# Patient Record
Sex: Male | Born: 1950 | Hispanic: No | Marital: Married | Smoking: Never smoker
Health system: Southern US, Community
[De-identification: ages and names within clinical notes are randomized; demographics above are authoritative.]

## PROBLEM LIST (undated history)

## (undated) DIAGNOSIS — E785 Hyperlipidemia, unspecified: Secondary | ICD-10-CM

## (undated) DIAGNOSIS — M1990 Unspecified osteoarthritis, unspecified site: Secondary | ICD-10-CM

---

## 2002-04-03 ENCOUNTER — Emergency Department (HOSPITAL_COMMUNITY): Admission: EM | Admit: 2002-04-03 | Discharge: 2002-04-03 | Payer: Self-pay | Admitting: Emergency Medicine

## 2002-04-12 ENCOUNTER — Emergency Department (HOSPITAL_COMMUNITY): Admission: EM | Admit: 2002-04-12 | Discharge: 2002-04-12 | Payer: Self-pay | Admitting: Emergency Medicine

## 2007-03-12 ENCOUNTER — Emergency Department (HOSPITAL_COMMUNITY): Admission: EM | Admit: 2007-03-12 | Discharge: 2007-03-12 | Payer: Self-pay | Admitting: Emergency Medicine

## 2007-03-16 ENCOUNTER — Emergency Department (HOSPITAL_COMMUNITY): Admission: EM | Admit: 2007-03-16 | Discharge: 2007-03-16 | Payer: Self-pay | Admitting: Emergency Medicine

## 2015-12-25 ENCOUNTER — Emergency Department (HOSPITAL_COMMUNITY): Payer: Self-pay

## 2015-12-25 ENCOUNTER — Inpatient Hospital Stay (HOSPITAL_COMMUNITY)
Admission: EM | Admit: 2015-12-25 | Discharge: 2015-12-28 | DRG: 193 | Disposition: A | Discharge status: Home / Self Care | Payer: Self-pay | Attending: Internal Medicine | Admitting: Internal Medicine

## 2015-12-25 ENCOUNTER — Encounter (HOSPITAL_COMMUNITY): Payer: Self-pay | Admitting: Nurse Practitioner

## 2015-12-25 DIAGNOSIS — J439 Emphysema, unspecified: Secondary | ICD-10-CM | POA: Diagnosis present

## 2015-12-25 DIAGNOSIS — E860 Dehydration: Secondary | ICD-10-CM

## 2015-12-25 DIAGNOSIS — D638 Anemia in other chronic diseases classified elsewhere: Secondary | ICD-10-CM | POA: Diagnosis present

## 2015-12-25 DIAGNOSIS — E872 Acidosis, unspecified: Secondary | ICD-10-CM

## 2015-12-25 DIAGNOSIS — N289 Disorder of kidney and ureter, unspecified: Secondary | ICD-10-CM

## 2015-12-25 DIAGNOSIS — E869 Volume depletion, unspecified: Secondary | ICD-10-CM

## 2015-12-25 DIAGNOSIS — F17210 Nicotine dependence, cigarettes, uncomplicated: Secondary | ICD-10-CM | POA: Diagnosis present

## 2015-12-25 DIAGNOSIS — E43 Unspecified severe protein-calorie malnutrition: Secondary | ICD-10-CM | POA: Insufficient documentation

## 2015-12-25 DIAGNOSIS — J189 Pneumonia, unspecified organism: Principal | ICD-10-CM

## 2015-12-25 DIAGNOSIS — R634 Abnormal weight loss: Secondary | ICD-10-CM

## 2015-12-25 DIAGNOSIS — E785 Hyperlipidemia, unspecified: Secondary | ICD-10-CM | POA: Diagnosis present

## 2015-12-25 DIAGNOSIS — Z681 Body mass index (BMI) 19 or less, adult: Secondary | ICD-10-CM

## 2015-12-25 DIAGNOSIS — R64 Cachexia: Secondary | ICD-10-CM

## 2015-12-25 DIAGNOSIS — N179 Acute kidney failure, unspecified: Secondary | ICD-10-CM | POA: Diagnosis present

## 2015-12-25 DIAGNOSIS — D649 Anemia, unspecified: Secondary | ICD-10-CM

## 2015-12-25 HISTORY — DX: Unspecified osteoarthritis, unspecified site: M19.90

## 2015-12-25 HISTORY — DX: Hyperlipidemia, unspecified: E78.5

## 2015-12-25 LAB — CBC WITH DIFFERENTIAL/PLATELET
BASOS ABS: 0 10*3/uL (ref 0.0–0.1)
Basophils Relative: 0 %
EOS ABS: 0 10*3/uL (ref 0.0–0.7)
Eosinophils Relative: 0 %
HEMATOCRIT: 21.8 % — AB (ref 39.0–52.0)
HEMOGLOBIN: 7.1 g/dL — AB (ref 13.0–17.0)
LYMPHS PCT: 6 %
Lymphs Abs: 0.5 10*3/uL — ABNORMAL LOW (ref 0.7–4.0)
MCH: 27.4 pg (ref 26.0–34.0)
MCHC: 32.6 g/dL (ref 30.0–36.0)
MCV: 84.2 fL (ref 78.0–100.0)
MONOS PCT: 4 %
Monocytes Absolute: 0.3 10*3/uL (ref 0.1–1.0)
Neutro Abs: 7.4 10*3/uL (ref 1.7–7.7)
Neutrophils Relative %: 90 %
Platelets: 425 10*3/uL — ABNORMAL HIGH (ref 150–400)
RBC: 2.59 MIL/uL — AB (ref 4.22–5.81)
RDW: 15.8 % — AB (ref 11.5–15.5)
WBC Morphology: INCREASED
WBC: 8.2 10*3/uL (ref 4.0–10.5)

## 2015-12-25 LAB — COMPREHENSIVE METABOLIC PANEL
ALK PHOS: 69 U/L (ref 38–126)
ALT: 25 U/L (ref 17–63)
ANION GAP: 10 (ref 5–15)
AST: 24 U/L (ref 15–41)
Albumin: 2.8 g/dL — ABNORMAL LOW (ref 3.5–5.0)
BILIRUBIN TOTAL: 0.6 mg/dL (ref 0.3–1.2)
BUN: 50 mg/dL — ABNORMAL HIGH (ref 6–20)
CALCIUM: 8.5 mg/dL — AB (ref 8.9–10.3)
CO2: 18 mmol/L — ABNORMAL LOW (ref 22–32)
Chloride: 112 mmol/L — ABNORMAL HIGH (ref 101–111)
Creatinine, Ser: 1.69 mg/dL — ABNORMAL HIGH (ref 0.61–1.24)
GFR, EST AFRICAN AMERICAN: 48 mL/min — AB (ref >60)
GFR, EST NON AFRICAN AMERICAN: 41 mL/min — AB (ref >60)
Glucose, Bld: 162 mg/dL — ABNORMAL HIGH (ref 65–99)
POTASSIUM: 4.8 mmol/L (ref 3.5–5.1)
Sodium: 140 mmol/L (ref 135–145)
TOTAL PROTEIN: 6.5 g/dL (ref 6.5–8.1)

## 2015-12-25 LAB — POC OCCULT BLOOD, ED: Fecal Occult Bld: NEGATIVE

## 2015-12-25 LAB — I-STAT CG4 LACTIC ACID, ED
LACTIC ACID, VENOUS: 1.49 mmol/L (ref 0.5–1.9)
Lactic Acid, Venous: 1.09 mmol/L (ref 0.5–1.9)

## 2015-12-25 LAB — URINALYSIS, ROUTINE W REFLEX MICROSCOPIC
BILIRUBIN URINE: NEGATIVE
Glucose, UA: NEGATIVE mg/dL
HGB URINE DIPSTICK: NEGATIVE
KETONES UR: NEGATIVE mg/dL
Leukocytes, UA: NEGATIVE
NITRITE: NEGATIVE
PH: 5.5 (ref 5.0–8.0)
Protein, ur: NEGATIVE mg/dL
SPECIFIC GRAVITY, URINE: 1.02 (ref 1.005–1.030)

## 2015-12-25 MED ORDER — SODIUM CHLORIDE 0.9 % IV BOLUS (SEPSIS)
1290.0000 mL | Freq: Once | INTRAVENOUS | Status: AC
Start: 2015-12-25 — End: 2015-12-25
  Administered 2015-12-25: 1290 mL via INTRAVENOUS

## 2015-12-25 MED ORDER — DEXTROSE 5 % IV SOLN
500.0000 mg | Freq: Once | INTRAVENOUS | Status: AC
  Administered 2015-12-25: 500 mg via INTRAVENOUS
  Filled 2015-12-25: qty 500

## 2015-12-25 MED ORDER — CEFTRIAXONE SODIUM 1 G IJ SOLR
1.0000 g | Freq: Once | INTRAMUSCULAR | Status: AC
  Administered 2015-12-25: 1 g via INTRAVENOUS
  Filled 2015-12-25: qty 10

## 2015-12-25 MED ORDER — ACETAMINOPHEN 325 MG PO TABS
650.0000 mg | ORAL_TABLET | Freq: Once | ORAL | Status: AC
  Administered 2015-12-25: 650 mg via ORAL
  Filled 2015-12-25: qty 2

## 2015-12-25 NOTE — ED Provider Notes (Signed)
[facility]-EMERGENCY DEPT Provider Note   CSN: [id number] Arrival date & time: 12/25/15  1641  First Provider Contact:  First MD Initiated Contact with Patient 12/25/15 1846        History   Chief Complaint Chief Complaint  Patient presents with  . Shortness of Breath  . Fatigue    HPI Austin Ochoa is a 65 y.o. male.  He is brought in by family members from a community clinic for evaluation of cough. He has not seen a primary care provider in the last 3 years. He has had a 50 pound weight loss over the last several months. 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 He typically eats very little, reason for this is unknown. He has had a fever for about a week. There's been no nausea, vomiting, diarrhea, focal weakness or dizziness. He smokes cigarettes but does not drink alcohol or take illegal drugs. He is here with a family member who has not seen him for several years. There are no other known modifying factors.  HPI  Past Medical History:  Diagnosis Date  . Arthritis   . Hyperlipidemia     There are no active problems to display for this patient.   History reviewed. No pertinent surgical history.     Home Medications    Prior to Admission medications   Medication Sig Start Date End Date Taking? Authorizing Provider  Phenylephrine-DM (THERAFLU COLD/COUGH DAYTIME PO) Take 1 tablet by mouth every 6 (six) hours as needed (flu symtpoms).   Yes Historical Provider, MD    Family History No family history on file.  Social History Social History  Substance Use Topics  . Smoking status: Not on file  . Smokeless tobacco: Not on file  . Alcohol use Not on file     Allergies   Review of patient's allergies indicates no known allergies.   Review of Systems Review of Systems  All other systems reviewed and are negative.    Physical Exam Updated Vital Signs BP 98/69 (BP Location: Right Arm)   Pulse 64   Temp 100.5 F (38.1 C) (Oral)   Resp 18   Ht  (1.6  m)   Wt 94 lb (42.6 kg)   SpO2 94%   BMI 16.65 kg/m   Physical Exam  Constitutional: He appears well-developed.  He is cachectic in appearance  HENT:  Head: Normocephalic and atraumatic.  Poor dentition, no trismus  Eyes: Conjunctivae are normal.  Neck: Neck supple.  Cardiovascular: Normal rate and regular rhythm.   No murmur heard. Pulmonary/Chest: Effort normal. No respiratory distress.  Diffuse rhonchi with decreased air movement, right base.  Abdominal: Soft. He exhibits no distension and no mass. There is no tenderness. There is no rebound.  Musculoskeletal: He exhibits no edema.  Neurological: He is alert.  Skin: Skin is warm and dry.  Psychiatric: He has a normal mood and affect.  Nursing note and vitals reviewed.    ED Treatments / Results  Labs (all labs ordered are listed, but only abnormal results are displayed) Labs Reviewed  COMPREHENSIVE METABOLIC PANEL - Abnormal; Notable for the following:       Result Value   Chloride 112 (*)    CO2 18 (*)    Glucose, Bld 162 (*)    BUN 50 (*)    Creatinine, Ser 1.69 (*)    Calcium 8.5 (*)    Albumin 2.8 (*)    GFR calc non Af Amer 41 (*)    GFR  calc Af Amer 48 (*)    All other components within normal limits  CBC WITH DIFFERENTIAL/PLATELET - Abnormal; Notable for the following:    RBC 2.59 (*)    Hemoglobin 7.1 (*)    HCT 21.8 (*)    RDW 15.8 (*)    Platelets 425 (*)    Lymphs Abs 0.5 (*)    All other components within normal limits  URINALYSIS, ROUTINE W REFLEX MICROSCOPIC (NOT AT [facility]) - Abnormal; Notable for the following:    APPearance CLOUDY (*)    All other components within normal limits  CULTURE, BLOOD (ROUTINE X 2)  CULTURE, BLOOD (ROUTINE X 2)  URINE CULTURE  I-STAT CG4 LACTIC ACID, ED  I-STAT CG4 LACTIC ACID, ED  I-STAT CG4 LACTIC ACID, ED  POC OCCULT BLOOD, ED    EKG  EKG Interpretation  Date/Time:  Tuesday December 25 2015 18:04:05 EDT Ventricular Rate:  81 PR Interval:    QRS  Duration: 91 QT Interval:  352 QTC Calculation: 409 R Axis:   -51 Text Interpretation:  Sinus rhythm Borderline short PR interval Left axis deviation ST elevation, consider inferior injury No old tracing to compare Confirmed by [facility]  MD, Santita Hunsberger [phone]) on 12/25/2015 6:45:47 PM       Radiology Dg Chest 2 View  Result Date: 12/25/2015 CLINICAL DATA:  Fever, lethargy and shortness of breath. EXAM: CHEST  2 VIEW COMPARISON:  None. FINDINGS: Heart size is normal. Mediastinal shadows are normal. There is bilateral lower lobe bronchopneumonia left worse than right. Upper lobes probably show emphysema. No demonstrable effusion. No acute bone finding. IMPRESSION: Bilateral lower lobe pneumonia left worse than right. Probable underlying emphysema. Electronically Signed   By: Paulina Fusi M.D.   On: 12/25/2015 19:37   Procedures Procedures (including critical care time)  Medications Ordered in ED Medications  cefTRIAXone (ROCEPHIN) 1 g in dextrose 5 % 50 mL IVPB (0 g Intravenous Stopped 12/25/15 1950)  azithromycin (ZITHROMAX) 500 mg in dextrose 5 % 250 mL IVPB (0 mg Intravenous Stopped 12/25/15 2011)  sodium chloride 0.9 % bolus 1,290 mL (0 mLs Intravenous Stopped 12/25/15 2100)  acetaminophen (TYLENOL) tablet 650 mg (650 mg Oral Given 12/25/15 2101)     Initial Impression / Assessment and Plan / ED Course  I have reviewed the triage vital signs and the nursing notes.  Pertinent labs & imaging results that were available during my care of the patient were reviewed by me and considered in my medical decision making (see chart for details).  Clinical Course    Medications  cefTRIAXone (ROCEPHIN) 1 g in dextrose 5 % 50 mL IVPB (0 g Intravenous Stopped 12/25/15 1950)  azithromycin (ZITHROMAX) 500 mg in dextrose 5 % 250 mL IVPB (0 mg Intravenous Stopped 12/25/15 2011)  sodium chloride 0.9 % bolus 1,290 mL (0 mLs Intravenous Stopped 12/25/15 2100)  acetaminophen (TYLENOL) tablet 650 mg (650 mg Oral  Given 12/25/15 2101)    Patient Vitals for the past 24 hrs:  BP Temp Temp src Pulse Resp SpO2 Height Weight  12/25/15 2156 98/69 100.5 F (38.1 C) Oral 64 18 94 % - -  12/25/15 1900 114/73 - - 78 (!) 29 95 % - -  12/25/15 1830 128/82 - - 110 21 97 % - -  12/25/15 1802 125/68 102.6 F (39.2 C) Rectal 88 24 93 % - -  12/25/15 1658 - - - - - - 5\' 3"  (1.6 m) 94 lb (42.6 kg)  12/25/15 1653 - - - - -  92 % - -    11:10 PM Reevaluation with update and discussion. After initial assessment and treatment, an updated evaluation reveals No change in clinical status. Findings discussed with family members who related it to the patient. Vena Bassinger L   11:10 PM-Consult complete with hospitalist. Patient case explained and discussed. He agrees to admit patient for further evaluation and treatment. Call ended at 2315   Final Clinical Impressions(s) / ED Diagnoses   Final diagnoses:  CAP (community acquired pneumonia)  Dehydration  Anemia, unspecified anemia type  Weight loss   Fever with infection consistent with pneumonia. This is considered community-acquired. Substantial weight loss with cachexia, is an indicator, for more serious process such as disseminated cancer. No overt signs of cancer. Nonspecific anemia with brown stool on rectal exam. He will require admission for further treatment and evaluation.  Nursing Notes Reviewed/ Care Coordinated, and agree without changes. Applicable Imaging Reviewed.  Interpretation of Laboratory Data incorporated into ED treatment  Plan: Admit  New Prescriptions New Prescriptions   No medications on file     Mancel Bale, MD 12/25/15 2320

## 2015-12-25 NOTE — H&P (Signed)
[facility] History and Physical  Austin Ochoa XHB:[id number] DOB: 08/09/1950 DOA: 12/25/2015  Referring physician: Dr Eulis Foster   PCP: No primary care provider on file.   Chief Complaint: Wt loss, fever  HPI: Austin Ochoa is a 65 y.o. male with no chron medical conditions comes to ED with poor oral intake, ^'d lethargy and gen'd weakness, ^'d SOB over the past week.  In ED has temp 102 deg and CXR shows emphysema w patchy infiltrates.  Asked to see for PNA admission.    Nephew provides history and also the patient.  He was born in [place], works as a Astronomer at the [employer], has 2 children and his wife in [place], [place] children in [place].  Has been having problems with eating for over a month.  No appetite, and abd/ chest pain with or after eating.  No diarrhea.  +some nausea w vomiting.  Wt loss, not sure how much. Fatigued, +CP and upper back pain.  No prod cough.  No voiding issues.  ER MD did rectal exam returned brown stool, no masses.       Past Medical History  Past Medical History:  Diagnosis Date  . Arthritis   . Hyperlipidemia    Past Surgical History History reviewed. No pertinent surgical history. Family History No family history on file. Social History  has no tobacco, alcohol, and drug history on file. Allergies No Known Allergies Home medications Prior to Admission medications   Medication Sig Start Date End Date Taking? Authorizing Provider  Phenylephrine-DM (THERAFLU COLD/COUGH DAYTIME PO) Take 1 tablet by mouth every 6 (six) hours as needed (flu symtpoms).   Yes Historical Provider, MD   Liver Function Tests  Recent Labs Lab 12/25/15 1818  AST 24  ALT 25  ALKPHOS 69  BILITOT 0.6  PROT 6.5  ALBUMIN 2.8*   No results for input(s): LIPASE, AMYLASE in the last 168 hours. CBC  Recent Labs Lab 12/25/15 1818  WBC 8.2  NEUTROABS 7.4  HGB 7.1*  HCT 21.8*  MCV 84.2  PLT 810*   Basic Metabolic Panel  Recent Labs Lab 12/25/15 1818   NA 140  K 4.8  CL 112*  CO2 18*  GLUCOSE 162*  BUN 50*  CREATININE 1.69*  CALCIUM 8.5*     Vitals:   12/25/15 1802 12/25/15 1830 12/25/15 1900 12/25/15 2156  BP: 125/68 128/82 114/73 98/69  Pulse: 88 110 78 64  Resp: 24 21 (!) 29 18  Temp: 102.6 F (39.2 C)   100.5 F (38.1 C)  TempSrc: Rectal   Oral  SpO2: 93% 97% 95% 94%  Weight:      Height:       Exam: Gen this emaciated hispanic male, looks older than stated age, smiling No rash, cyanosis or gangrene Sclera anicteric, throat clear and very dry mucous membranes  No jvd or bruits, no palp nodes Chest coarse rhonchi L > R base, pleural rubs L base  RRR no MRG  Abd soft ntnd no mass or ascites +bs , scaphoid GU normal male, no inguinal nodes MS no joint effusions or deformity Ext no LE edema / no wounds or ulcers Neuro is alert, Ox 3, answers questions appropriately  Na 140  K 4.8  CO2 18  BUN 50  Cr 1.69   Ca 8.5  Alb 2.8  LFT"s ok  eGFR 45  WBC 8k  Hb 7.1  plt 425 UA negative EKG = NSR, no acute changes   Assessment: 1.  Fever/  cough/ abnormal CXR - suspect PNA 2.  Wt loss -  possibly GI malignancy vs other, as having pain with or after eating 3.  Vol depletion 4.  Renal insufficiency - prob due to dec'd vol 5.  Mild met acidosis 6.  Anemia unclear cause , normal MCV- get type and screen, anemia panel, trasnfuse prn  Plan - admit, IVF, abx for CAP, CT chest/ abdomen no contrast.  Get GI to eval if CT not diagnostic     Sol Blazing [facility] Pager [phone]  Cell [phone]  If 7PM-7AM, please contact night-coverage [email or web address] Password [facility] 12/25/2015, 11:25 PM

## 2015-12-25 NOTE — ED Triage Notes (Signed)
Patient presents to [facility] ED via [facility] with complaints of poor oral intake, increased lethargy, and worsening shortness of breath over the past week. He comes today from his primary care clinic.

## 2015-12-25 NOTE — ED Provider Notes (Signed)
[facility]-EMERGENCY DEPT Provider Note   CSN: [id number] Arrival date & time: 12/25/15  1641  First Provider Contact:  First MD Initiated Contact with Patient 12/25/15 1846        History   Chief Complaint Chief Complaint  Patient presents with  . Shortness of Breath  . Fatigue    HPI Austin Ochoa is a 65 y.o. male.  HPI  Past Medical History:  Diagnosis Date  . Arthritis   . Hyperlipidemia     Patient Active Problem List   Diagnosis Date Noted  . CAP (community acquired pneumonia) 12/25/2015  . Volume depletion 12/25/2015  . Weight loss 12/25/2015  . Renal insufficiency 12/25/2015  . Metabolic acidosis 12/25/2015  . Anemia, normocytic normochromic 12/25/2015    History reviewed. No pertinent surgical history.     Home Medications    Prior to Admission medications   Medication Sig Start Date End Date Taking? Authorizing Provider  Phenylephrine-DM (THERAFLU COLD/COUGH DAYTIME PO) Take 1 tablet by mouth every 6 (six) hours as needed (flu symtpoms).   Yes Historical Provider, MD    Family History No family history on file.  Social History Social History  Substance Use Topics  . Smoking status: Not on file  . Smokeless tobacco: Not on file  . Alcohol use Not on file     Allergies   Review of patient's allergies indicates no known allergies.   Review of Systems Review of Systems   Physical Exam Updated Vital Signs BP 98/69 (BP Location: Right Arm)   Pulse 64   Temp 100.5 F (38.1 C) (Oral)   Resp 18   Ht  (1.6 m)   Wt 94 lb (42.6 kg)   SpO2 94%   BMI 16.65 kg/m   Physical Exam  Genitourinary:  Genitourinary Comments: Normal anus. Small amount of brown stool in the rectal vault. No rectal mass or visualize blood.     ED Treatments / Results  Labs (all labs ordered are listed, but only abnormal results are displayed) Labs Reviewed  COMPREHENSIVE METABOLIC PANEL - Abnormal; Notable for the following:       Result Value   Chloride 112 (*)    CO2 18 (*)    Glucose, Bld 162 (*)    BUN 50 (*)    Creatinine, Ser 1.69 (*)    Calcium 8.5 (*)    Albumin 2.8 (*)    GFR calc non Af Amer 41 (*)    GFR calc Af Amer 48 (*)    All other components within normal limits  CBC WITH DIFFERENTIAL/PLATELET - Abnormal; Notable for the following:    RBC 2.59 (*)    Hemoglobin 7.1 (*)    HCT 21.8 (*)    RDW 15.8 (*)    Platelets 425 (*)    Lymphs Abs 0.5 (*)    All other components within normal limits  URINALYSIS, ROUTINE W REFLEX MICROSCOPIC (NOT AT [facility]) - Abnormal; Notable for the following:    APPearance CLOUDY (*)    All other components within normal limits  CULTURE, BLOOD (ROUTINE X 2)  CULTURE, BLOOD (ROUTINE X 2)  URINE CULTURE  I-STAT CG4 LACTIC ACID, ED  I-STAT CG4 LACTIC ACID, ED  I-STAT CG4 LACTIC ACID, ED  POC OCCULT BLOOD, ED    EKG  EKG Interpretation  Date/Time:  Tuesday December 25 2015 18:04:05 EDT Ventricular Rate:  81 PR Interval:    QRS Duration: 91 QT Interval:  352 QTC Calculation: 409 R Axis:   -  51 Text Interpretation:  Sinus rhythm Borderline short PR interval Left axis deviation ST elevation, consider inferior injury No old tracing to compare Confirmed by [facility]  MD, Adrianna Dudas [phone]) on 12/25/2015 6:45:47 PM       Radiology Dg Chest 2 View  Result Date: 12/25/2015 CLINICAL DATA:  Fever, lethargy and shortness of breath. EXAM: CHEST  2 VIEW COMPARISON:  None. FINDINGS: Heart size is normal. Mediastinal shadows are normal. There is bilateral lower lobe bronchopneumonia left worse than right. Upper lobes probably show emphysema. No demonstrable effusion. No acute bone finding. IMPRESSION: Bilateral lower lobe pneumonia left worse than right. Probable underlying emphysema. Electronically Signed   By: Paulina Fusi M.D.   On: 12/25/2015 19:37   Procedures Procedures (including critical care time)  Medications Ordered in ED Medications  cefTRIAXone (ROCEPHIN) 1 g in dextrose 5 % 50 mL  IVPB (0 g Intravenous Stopped 12/25/15 1950)  azithromycin (ZITHROMAX) 500 mg in dextrose 5 % 250 mL IVPB (0 mg Intravenous Stopped 12/25/15 2011)  sodium chloride 0.9 % bolus 1,290 mL (0 mLs Intravenous Stopped 12/25/15 2100)  acetaminophen (TYLENOL) tablet 650 mg (650 mg Oral Given 12/25/15 2101)     Initial Impression / Assessment and Plan / ED Course  I have reviewed the triage vital signs and the nursing notes.  Pertinent labs & imaging results that were available during my care of the patient were reviewed by me and considered in my medical decision making (see chart for details).  Clinical Course    Medications  cefTRIAXone (ROCEPHIN) 1 g in dextrose 5 % 50 mL IVPB (0 g Intravenous Stopped 12/25/15 1950)  azithromycin (ZITHROMAX) 500 mg in dextrose 5 % 250 mL IVPB (0 mg Intravenous Stopped 12/25/15 2011)  sodium chloride 0.9 % bolus 1,290 mL (0 mLs Intravenous Stopped 12/25/15 2100)  acetaminophen (TYLENOL) tablet 650 mg (650 mg Oral Given 12/25/15 2101)    Patient Vitals for the past 24 hrs:  BP Temp Temp src Pulse Resp SpO2 Height Weight  12/25/15 2156 98/69 100.5 F (38.1 C) Oral 64 18 94 % - -  12/25/15 1900 114/73 - - 78 (!) 29 95 % - -  12/25/15 1830 128/82 - - 110 21 97 % - -  12/25/15 1802 125/68 102.6 F (39.2 C) Rectal 88 24 93 % - -  12/25/15 1658 - - - - - - 5\' 3"  (1.6 m) 94 lb (42.6 kg)  12/25/15 1653 - - - - - 92 % - -    11:45 PM Reevaluation with update and discussion. After initial assessment and treatment, an updated evaluation reveals No change in clinical status. Case discussed with family members who related the findings to the patient. [name]   Final Clinical Impressions(s) / ED Diagnoses   Final diagnoses:  CAP (community acquired pneumonia)  Dehydration  Anemia, unspecified anemia type  Weight loss    Nursing Notes Reviewed/ Care Coordinated, and agree without changes. Applicable Imaging Reviewed.  Interpretation of Laboratory Data  incorporated into ED treatment  Plan: Admit  New Prescriptions New Prescriptions   No medications on file     Mancel Bale, MD 12/25/15 2346

## 2015-12-26 ENCOUNTER — Inpatient Hospital Stay (HOSPITAL_COMMUNITY): Payer: Self-pay

## 2015-12-26 ENCOUNTER — Encounter (HOSPITAL_COMMUNITY): Payer: Self-pay | Admitting: Radiology

## 2015-12-26 DIAGNOSIS — J189 Pneumonia, unspecified organism: Principal | ICD-10-CM

## 2015-12-26 LAB — CBC
HCT: 20.3 % — ABNORMAL LOW (ref 39.0–52.0)
HCT: 32.4 % — ABNORMAL LOW (ref 39.0–52.0)
HEMOGLOBIN: 11 g/dL — AB (ref 13.0–17.0)
Hemoglobin: 6.6 g/dL — CL (ref 13.0–17.0)
MCH: 27.5 pg (ref 26.0–34.0)
MCH: 27.8 pg (ref 26.0–34.0)
MCHC: 32.5 g/dL (ref 30.0–36.0)
MCHC: 34 g/dL (ref 30.0–36.0)
MCV: 84.6 fL (ref 78.0–100.0)
MCV: 82 fL (ref 78.0–100.0)
PLATELETS: 362 10*3/uL (ref 150–400)
PLATELETS: 360 10*3/uL (ref 150–400)
RBC: 2.4 MIL/uL — AB (ref 4.22–5.81)
RBC: 3.95 MIL/uL — AB (ref 4.22–5.81)
RDW: 15.9 % — AB (ref 11.5–15.5)
RDW: 15.3 % (ref 11.5–15.5)
WBC: 7.8 10*3/uL (ref 4.0–10.5)
WBC: 11.4 10*3/uL — AB (ref 4.0–10.5)

## 2015-12-26 LAB — FOLATE: Folate: 11.5 ng/mL (ref >5.9)

## 2015-12-26 LAB — BASIC METABOLIC PANEL
Anion gap: 5 (ref 5–15)
BUN: 38 mg/dL — AB (ref 6–20)
CALCIUM: 7.2 mg/dL — AB (ref 8.9–10.3)
CO2: 17 mmol/L — ABNORMAL LOW (ref 22–32)
CREATININE: 1.34 mg/dL — AB (ref 0.61–1.24)
Chloride: 120 mmol/L — ABNORMAL HIGH (ref 101–111)
GFR calc non Af Amer: 54 mL/min — ABNORMAL LOW (ref >60)
Glucose, Bld: 140 mg/dL — ABNORMAL HIGH (ref 65–99)
Potassium: 4.4 mmol/L (ref 3.5–5.1)
SODIUM: 142 mmol/L (ref 135–145)

## 2015-12-26 LAB — ABO/RH: ABO/RH(D): O POS

## 2015-12-26 LAB — COMPREHENSIVE METABOLIC PANEL
ALK PHOS: 61 U/L (ref 38–126)
ALT: 19 U/L (ref 17–63)
AST: 18 U/L (ref 15–41)
Albumin: 2.3 g/dL — ABNORMAL LOW (ref 3.5–5.0)
Anion gap: 7 (ref 5–15)
BUN: 35 mg/dL — AB (ref 6–20)
CHLORIDE: 108 mmol/L (ref 101–111)
CO2: 21 mmol/L — AB (ref 22–32)
CREATININE: 1.35 mg/dL — AB (ref 0.61–1.24)
Calcium: 7.6 mg/dL — ABNORMAL LOW (ref 8.9–10.3)
GFR calc Af Amer: >60 mL/min (ref >60)
GFR, EST NON AFRICAN AMERICAN: 54 mL/min — AB (ref >60)
Glucose, Bld: 156 mg/dL — ABNORMAL HIGH (ref 65–99)
Potassium: 3.8 mmol/L (ref 3.5–5.1)
SODIUM: 136 mmol/L (ref 135–145)
Total Bilirubin: 0.8 mg/dL (ref 0.3–1.2)
Total Protein: 5.3 g/dL — ABNORMAL LOW (ref 6.5–8.1)

## 2015-12-26 LAB — RETICULOCYTES
RBC.: 2.4 MIL/uL — AB (ref 4.22–5.81)
RETIC COUNT ABSOLUTE: 31.2 10*3/uL (ref 19.0–186.0)
Retic Ct Pct: 1.3 % (ref 0.4–3.1)

## 2015-12-26 LAB — FERRITIN: Ferritin: 69 ng/mL (ref 24–336)

## 2015-12-26 LAB — PHOSPHORUS: PHOSPHORUS: 3.4 mg/dL (ref 2.5–4.6)

## 2015-12-26 LAB — PREPARE RBC (CROSSMATCH)

## 2015-12-26 LAB — IRON AND TIBC: TIBC: 169 ug/dL — AB (ref 250–450)

## 2015-12-26 LAB — HIV ANTIBODY (ROUTINE TESTING W REFLEX): HIV Screen 4th Generation wRfx: NONREACTIVE

## 2015-12-26 LAB — MAGNESIUM
MAGNESIUM: 1.8 mg/dL (ref 1.7–2.4)
Magnesium: 1.8 mg/dL (ref 1.7–2.4)

## 2015-12-26 LAB — TSH: TSH: 0.516 u[IU]/mL (ref 0.350–4.500)

## 2015-12-26 LAB — STREP PNEUMONIAE URINARY ANTIGEN: Strep Pneumo Urinary Antigen: POSITIVE — AB

## 2015-12-26 LAB — VITAMIN B12: VITAMIN B 12: 196 pg/mL (ref 180–914)

## 2015-12-26 LAB — CORTISOL: Cortisol, Plasma: 15.3 ug/dL

## 2015-12-26 MED ORDER — GUAIFENESIN 100 MG/5ML PO SOLN
5.0000 mL | ORAL | Status: DC | PRN
  Administered 2015-12-26: 100 mg via ORAL
  Filled 2015-12-26: qty 10

## 2015-12-26 MED ORDER — DIPHENHYDRAMINE HCL 50 MG/ML IJ SOLN: 25.0000 mg | Freq: Four times a day (QID) | INTRAMUSCULAR | Status: DC | PRN

## 2015-12-26 MED ORDER — DIATRIZOATE MEGLUMINE & SODIUM 66-10 % PO SOLN
15.0000 mL | ORAL | Status: DC | PRN
  Filled 2015-12-26: qty 30

## 2015-12-26 MED ORDER — SODIUM CHLORIDE 0.9 % IV SOLN: Freq: Once | INTRAVENOUS | Status: DC

## 2015-12-26 MED ORDER — CETYLPYRIDINIUM CHLORIDE 0.05 % MT LIQD
7.0000 mL | Freq: Two times a day (BID) | OROMUCOSAL | Status: DC
  Administered 2015-12-26: 7 mL via OROMUCOSAL

## 2015-12-26 MED ORDER — OXYCODONE-ACETAMINOPHEN 5-325 MG PO TABS
1.0000 | ORAL_TABLET | Freq: Four times a day (QID) | ORAL | Status: DC | PRN
  Administered 2015-12-26: 1 via ORAL
  Filled 2015-12-26: qty 1

## 2015-12-26 MED ORDER — DOCUSATE SODIUM 100 MG PO CAPS
200.0000 mg | ORAL_CAPSULE | Freq: Two times a day (BID) | ORAL | Status: DC
  Administered 2015-12-26: 200 mg via ORAL
  Filled 2015-12-26 (×2): qty 2

## 2015-12-26 MED ORDER — ENOXAPARIN SODIUM 30 MG/0.3ML ~~LOC~~ SOLN
30.0000 mg | SUBCUTANEOUS | Status: DC
  Administered 2015-12-26 – 2015-12-28 (×2): 30 mg via SUBCUTANEOUS
  Filled 2015-12-26 (×3): qty 0.3

## 2015-12-26 MED ORDER — BISACODYL 10 MG RE SUPP
10.0000 mg | Freq: Every day | RECTAL | Status: DC
  Administered 2015-12-26: 10 mg via RECTAL
  Filled 2015-12-26: qty 1

## 2015-12-26 MED ORDER — SODIUM CHLORIDE 0.9 % IV BOLUS (SEPSIS)
2000.0000 mL | Freq: Once | INTRAVENOUS | Status: AC
  Administered 2015-12-26: 2000 mL via INTRAVENOUS

## 2015-12-26 MED ORDER — POLYETHYLENE GLYCOL 3350 17 G PO PACK
17.0000 g | PACK | Freq: Two times a day (BID) | ORAL | Status: DC
  Administered 2015-12-26: 17 g via ORAL
  Filled 2015-12-26 (×2): qty 1

## 2015-12-26 MED ORDER — SODIUM CHLORIDE 0.9 % IV SOLN
INTRAVENOUS | Status: DC
  Administered 2015-12-26: 22:00:00 via INTRAVENOUS

## 2015-12-26 MED ORDER — STERILE WATER FOR INJECTION IV SOLN
150.0000 meq | INTRAVENOUS | Status: DC
  Administered 2015-12-26 (×2): 150 meq via INTRAVENOUS
  Filled 2015-12-26 (×3): qty 850

## 2015-12-26 MED ORDER — SODIUM CHLORIDE 0.9 % IV SOLN
Freq: Once | INTRAVENOUS | Status: AC
  Administered 2015-12-26: 10:00:00 via INTRAVENOUS

## 2015-12-26 MED ORDER — DEXTROSE 5 % IV SOLN
1.0000 g | INTRAVENOUS | Status: DC
  Administered 2015-12-26 – 2015-12-27 (×2): 1 g via INTRAVENOUS
  Filled 2015-12-26 (×3): qty 10

## 2015-12-26 MED ORDER — AZITHROMYCIN 500 MG IV SOLR
500.0000 mg | INTRAVENOUS | Status: DC
  Administered 2015-12-26 – 2015-12-27 (×2): 500 mg via INTRAVENOUS
  Filled 2015-12-26 (×3): qty 500

## 2015-12-26 MED ORDER — CHLORHEXIDINE GLUCONATE 0.12 % MT SOLN
15.0000 mL | Freq: Two times a day (BID) | OROMUCOSAL | Status: DC
  Administered 2015-12-26 – 2015-12-28 (×3): 15 mL via OROMUCOSAL
  Filled 2015-12-26 (×7): qty 15

## 2015-12-26 MED ORDER — IOPAMIDOL (ISOVUE-300) INJECTION 61%
100.0000 mL | Freq: Once | INTRAVENOUS | Status: AC | PRN
  Administered 2015-12-26: 100 mL via INTRAVENOUS

## 2015-12-26 NOTE — Progress Notes (Signed)
PROGRESS NOTE                                                                                                                                                                                                             Patient Demographics:    Austin Ochoa, is a 65 y.o. male, DOB - 1951-03-04, WUJ:[id number]  Admit date - 12/25/2015   Admitting Physician Delano Metz, MD  Outpatient Primary MD for the patient is No primary care provider on file.  LOS - 1  Chief Complaint  Patient presents with  . Shortness of Breath  . Fatigue       Brief Narrative    Austin Ochoa is a 65 y.o. male with no chronic medical conditions comes to ED with poor oral intake, Generalized weakness and mild productive cough along with low-grade fevers at home.  In ED has temp 102 deg and CXR shows emphysema w patchy infiltrates.  Asked to see for PNA admission.    Nephew provides history and also the patient.  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  Has been having problems with eating for over a month.  No appetite, and abd/ chest pain with or after eating.  No diarrhea.  +some nausea w vomiting.  Wt loss, not sure how much. Fatigued, +CP and upper back pain.  No prod cough.  No voiding issues.  ER MD did rectal exam returned brown stool, no masses.       Subjective:    Austin Ochoa today has, No headache, No chest pain, No abdominal pain - No Nausea, No new weakness tingling or numbness, No Mild Cough - no SOB.     Assessment  & Plan :     1. CAP -  Stable, on Emp ABX, supportive Rx, follow cultures, HIV serology pending.  2. AOCD - An Panel inconclusive, check Occult blood in stool, 2 units PRBC on 12-26-15, monitor.  3. Decreased appetite, weight loss, anemia. Cachectic appearance. Suspicious for underlying malignancy,  CT chest abdomen pelvis ordered will follow.  4. ARF due to dehydration. Improved with hydration and continue to monitor with gentle IV fluids and blood transfusion.  5. Mild metabolic acidosis due to number for above.  6. Mild hypothymia. Check TSH, cortisol, apply blankets, increase room temperature and monitor.    Family Communication  :  Family bedside  Code Status :  Full  Diet : Soft  Disposition Plan  :  Stay inpatient  Consults  :  None  Procedures  :   CT Chest-Abd-Pelvis -   DVT Prophylaxis  :  Lovenox   Lab Results  Component Value Date   PLT 362 12/26/2015    Inpatient Medications  Scheduled Meds: . sodium chloride   Intravenous Once  . antiseptic oral rinse  7 mL Mouth Rinse q12n4p  . azithromycin  500 mg Intravenous Q24H  . cefTRIAXone (ROCEPHIN)  IV  1 g Intravenous Q24H  . chlorhexidine  15 mL Mouth Rinse BID  . enoxaparin (LOVENOX) injection  30 mg Subcutaneous Q24H   Continuous Infusions: . sodium chloride    .  sodium bicarbonate 150 mEq in sterile water 1000 mL infusion 150 mEq (12/26/15 0456)   PRN Meds:.diatrizoate meglumine-sodium, diphenhydrAMINE, oxyCODONE-acetaminophen  Antibiotics  :    Anti-infectives    Start     Dose/Rate Route Frequency Ordered Stop   12/26/15 1800  azithromycin (ZITHROMAX) 500 mg in dextrose 5 % 250 mL IVPB     500 mg 250 mL/hr over 60 Minutes Intravenous Every 24 hours 12/26/15 0045 12/30/15 1759   12/26/15 1000  cefTRIAXone (ROCEPHIN) 1 g in dextrose 5 % 50 mL IVPB     1 g 100 mL/hr over 30 Minutes Intravenous Every 24 hours 12/26/15 0045 01/01/16 0959   12/25/15 1900  cefTRIAXone (ROCEPHIN) 1 g in dextrose 5 % 50 mL IVPB     1 g 100 mL/hr over 30 Minutes Intravenous  Once 12/25/15 1855 12/25/15 1950   12/25/15 1900  azithromycin (ZITHROMAX) 500 mg in dextrose 5 % 250 mL IVPB     500 mg 250 mL/hr over 60 Minutes Intravenous  Once 12/25/15 1855 12/25/15 2011         Objective:   Vitals:   12/26/15  0047 12/26/15 0627 12/26/15 0901 12/26/15 0915  BP: (!) 98/51 115/62 (!) 153/72 (!) 148/86  Pulse: (!) 54 (!) 52 (!) 54 (!) 51  Resp: 16 16 18 18   Temp: 97.7 F (36.5 C) 97.4 F (36.3 C) (!) 95.7 F (35.4 C) (!) 95.4 F (35.2 C)  TempSrc: Oral Oral Rectal Rectal  SpO2: 96% 98% 97% 96%  Weight:      Height:        Wt Readings from Last 3 Encounters:  12/25/15 42.6 kg (94 lb)     Intake/Output Summary (Last 24 hours) at 12/26/15 1052 Last data filed at 12/26/15 0901  Gross per 24 hour  Intake              335 ml  Output              650 ml  Net             -315 ml     Physical Exam  Awake Alert, Oriented X 3, No new F.N deficits, Normal affect [place].AT,PERRAL Supple Neck,No JVD, No cervical lymphadenopathy appriciated.  Symmetrical Chest wall movement, Good air movement bilaterally, CTAB RRR,No Gallops,Rubs or new Murmurs, No Parasternal Heave +ve B.Sounds, Abd Soft, No tenderness, No organomegaly appriciated, No rebound - guarding or rigidity. No Cyanosis, Clubbing or edema, No new Rash or bruise       Data Review:    CBC  Recent Labs Lab 12/25/15 1818  12/26/15 0517  WBC 8.2 7.8  HGB 7.1* 6.6*  HCT 21.8* 20.3*  PLT 425* 362  MCV 84.2 84.6  MCH 27.4 27.5  MCHC 32.6 32.5  RDW 15.8* 15.9*  LYMPHSABS 0.5*  --   MONOABS 0.3  --   EOSABS 0.0  --   BASOSABS 0.0  --     Chemistries   Recent Labs Lab 12/25/15 1818 12/26/15 0517  NA 140 142  K 4.8 4.4  CL 112* 120*  CO2 18* 17*  GLUCOSE 162* 140*  BUN 50* 38*  CREATININE 1.69* 1.34*  CALCIUM 8.5* 7.2*  MG  --  1.8  AST 24  --   ALT 25  --   ALKPHOS 69  --   BILITOT 0.6  --    ------------------------------------------------------------------------------------------------------------------ No results for input(s): CHOL, HDL, LDLCALC, TRIG, CHOLHDL, LDLDIRECT in the last 72 hours.  No results found for:  HGBA1C ------------------------------------------------------------------------------------------------------------------  Recent Labs  12/26/15 0517  TSH 0.516   ------------------------------------------------------------------------------------------------------------------  Recent Labs  12/26/15 0517  VITAMINB12 196  FOLATE 11.5  FERRITIN 69  TIBC 169*  IRON <5*  RETICCTPCT 1.3    Coagulation profile No results for input(s): INR, PROTIME in the last 168 hours.  No results for input(s): DDIMER in the last 72 hours.  Cardiac Enzymes No results for input(s): CKMB, TROPONINI, MYOGLOBIN in the last 168 hours.  Invalid input(s): CK ------------------------------------------------------------------------------------------------------------------ No results found for: BNP  Micro Results Recent Results (from the past 240 hour(s))  Blood Culture (routine x 2)     Status: None (Preliminary result)   Collection Time: 12/25/15  6:18 PM  Result Value Ref Range Status   Specimen Description   Final    BLOOD RIGHT FOREARM Performed at [facility]    Special Requests BOTTLES DRAWN AEROBIC AND ANAEROBIC  Final   Culture PENDING  Incomplete   Report Status PENDING  Incomplete    Radiology Reports Dg Chest 2 View  Result Date: 12/25/2015 CLINICAL DATA:  Fever, lethargy and shortness of breath. EXAM: CHEST  2 VIEW COMPARISON:  None. FINDINGS: Heart size is normal. Mediastinal shadows are normal. There is bilateral lower lobe bronchopneumonia left worse than right. Upper lobes probably show emphysema. No demonstrable effusion. No acute bone finding. IMPRESSION: Bilateral lower lobe pneumonia left worse than right. Probable underlying emphysema. Electronically Signed   By: Paulina Fusi M.D.   On: 12/25/2015 19:37   Time Spent in minutes  30   Tesla Bochicchio K M.D on 12/26/2015 at 10:52 AM  Between 7am to 7pm - Pager - [phone]  After 7pm go to [email or web address] -  password [facility]  [facility] -  Office  [phone]

## 2015-12-27 ENCOUNTER — Encounter (HOSPITAL_COMMUNITY): Payer: Self-pay

## 2015-12-27 LAB — CBC
HEMATOCRIT: 30 % — AB (ref 39.0–52.0)
HEMOGLOBIN: 10.1 g/dL — AB (ref 13.0–17.0)
MCH: 27.2 pg (ref 26.0–34.0)
MCHC: 33.7 g/dL (ref 30.0–36.0)
MCV: 80.9 fL (ref 78.0–100.0)
Platelets: 335 10*3/uL (ref 150–400)
RBC: 3.71 MIL/uL — ABNORMAL LOW (ref 4.22–5.81)
RDW: 15.8 % — ABNORMAL HIGH (ref 11.5–15.5)
WBC: 10.3 10*3/uL (ref 4.0–10.5)

## 2015-12-27 LAB — COMPREHENSIVE METABOLIC PANEL
ALBUMIN: 2.1 g/dL — AB (ref 3.5–5.0)
ALK PHOS: 57 U/L (ref 38–126)
ALT: 22 U/L (ref 17–63)
ANION GAP: 7 (ref 5–15)
AST: 31 U/L (ref 15–41)
BILIRUBIN TOTAL: 0.9 mg/dL (ref 0.3–1.2)
BUN: 28 mg/dL — ABNORMAL HIGH (ref 6–20)
CO2: 25 mmol/L (ref 22–32)
Calcium: 7.7 mg/dL — ABNORMAL LOW (ref 8.9–10.3)
Chloride: 108 mmol/L (ref 101–111)
Creatinine, Ser: 1.11 mg/dL (ref 0.61–1.24)
GFR calc Af Amer: >60 mL/min (ref >60)
GLUCOSE: 106 mg/dL — AB (ref 65–99)
Potassium: 3.5 mmol/L (ref 3.5–5.1)
Sodium: 140 mmol/L (ref 135–145)
Total Protein: 4.9 g/dL — ABNORMAL LOW (ref 6.5–8.1)

## 2015-12-27 LAB — URINE CULTURE

## 2015-12-27 LAB — MAGNESIUM: Magnesium: 1.6 mg/dL — ABNORMAL LOW (ref 1.7–2.4)

## 2015-12-27 MED ORDER — PRO-STAT SUGAR FREE PO LIQD
30.0000 mL | Freq: Two times a day (BID) | ORAL | Status: DC
  Administered 2015-12-27 – 2015-12-28 (×3): 30 mL via ORAL
  Filled 2015-12-27 (×3): qty 30

## 2015-12-27 MED ORDER — POLYETHYLENE GLYCOL 3350 17 G PO PACK
17.0000 g | PACK | Freq: Every day | ORAL | Status: DC
  Administered 2015-12-28: 17 g via ORAL
  Filled 2015-12-27 (×2): qty 1

## 2015-12-27 MED ORDER — CYANOCOBALAMIN 1000 MCG/ML IJ SOLN
1000.0000 ug | Freq: Every day | INTRAMUSCULAR | Status: DC
Start: 2015-12-27 — End: 2015-12-28
  Administered 2015-12-27: 1000 ug via INTRAMUSCULAR
  Filled 2015-12-27: qty 1

## 2015-12-27 MED ORDER — HYDRALAZINE HCL 20 MG/ML IJ SOLN: 10.0000 mg | Freq: Four times a day (QID) | INTRAMUSCULAR | Status: DC | PRN

## 2015-12-27 MED ORDER — SODIUM CHLORIDE 0.9 % IV SOLN
INTRAVENOUS | Status: DC
  Administered 2015-12-28: 01:00:00 via INTRAVENOUS

## 2015-12-27 MED ORDER — CARVEDILOL 3.125 MG PO TABS
3.1250 mg | ORAL_TABLET | Freq: Two times a day (BID) | ORAL | Status: DC
  Administered 2015-12-27 – 2015-12-28 (×3): 3.125 mg via ORAL
  Filled 2015-12-27 (×3): qty 1

## 2015-12-27 MED ORDER — BOOST / RESOURCE BREEZE PO LIQD
1.0000 | Freq: Three times a day (TID) | ORAL | Status: DC
  Administered 2015-12-27 – 2015-12-28 (×3): 1 via ORAL

## 2015-12-27 MED ORDER — SODIUM CHLORIDE 0.9 % IV SOLN
510.0000 mg | Freq: Once | INTRAVENOUS | Status: AC
  Administered 2015-12-27: 510 mg via INTRAVENOUS
  Filled 2015-12-27: qty 17

## 2015-12-27 NOTE — Progress Notes (Signed)
Spoke with patient and spouse at bedside. Spouse spoke for the patient. Discharge plan per spouse is to take patient to [place] where most of the family resides. Spouse will obtain medical records to take with them and follow with PCP there. Spouse is requesting paper work for employer to be completed by physician, notified Dr. Thedore Mins of request.

## 2015-12-27 NOTE — Progress Notes (Signed)
Initial Nutrition Assessment  DOCUMENTATION CODES:   Severe malnutrition in context of acute illness/injury, Underweight  INTERVENTION:  - Continue 30 mL Prostat BID, each supplement provides 100 kcal and 15 grams of protein. - Will order Boost Breeze po TID, each supplement provides 250 kcal and 9 grams of protein - Continue to encourage PO intakes of meals and supplements. - RD will continue to monitor for additional nutrition-related needs.  NUTRITION DIAGNOSIS:   Malnutrition related to acute illness, poor appetite as evidenced by severe depletion of muscle mass, severe depletion of body fat.  GOAL:   Patient will meet greater than or equal to 90% of their needs  MONITOR:   PO intake, Supplement acceptance, Weight trends, Labs, I & O's  REASON FOR ASSESSMENT:   Malnutrition Screening Tool  ASSESSMENT:   65 y.o. male with no chron medical conditions comes to ED with poor oral intake, ^'d lethargy and gen'd weakness, ^'d SOB over the past week.  In ED has temp 102 deg and CXR shows emphysema w patchy infiltrates.  Asked to see for PNA admission.    Pt seen for MST. BMI indicates underweight status. No intakes documented since admission. Pt is mainly Spanish-speaking but is able to understand and respond to some questions. Family members at bedside are fluent in [place] and [place] and are able to act as interpreters for RD. Per family, pt recently woke up from a nap and took a shower and d/t this has not yet had lunch; pt ate well for breakfast and had eggs, oatmeal, and fruit.   PTA pt was eating poorly for ~3 months. During this time he was consuming more beverages (Coke, V8 juice, water) than solid foods. At meals pt would primarily pick at/nibble on food and would be unable to eat much at any one time. He would sometimes experience vomiting with PO intakes. Pt reports that during this time he was experiencing abdominal pain and cramping with PO intakes. He explains that his  stomach would feel as though it was twisting and that pain would then radiate to his side and back. Since admission, pt has been able to consume more at meals and pain has subsided; today he is only experiencing back pain. Pt denies any episodes of emesis since admission. Daughter states that pt seems to be tolerating soft foods well.  Per family report, pt informed them that he had lost 15-20 lbs in the past 3 months. Based on CBW, this indicates 14-17.5% body weight loss in the past 3 months which is significant for time frame. Severe muscle and fat wasting also noted to upper and lower body.   Prostat already ordered BID. Will also order Boost Breeze as pt likes juice and was drinking this well PTA. Pt not meeting needs at this time or PTA.   Medications reviewed; 1000 mcg intramuscular vitamin B12/day, 510 mg IV Feraheme x1 dose today.  Labs reviewed; BUN: 28 mg/dL, Ca: 7.7 mg/dL, Mg: 1.6 mg/dL. IVF: 150 mEq sodium bicarb in sterile water @ 100 mL/hr.     Diet Order:  Diet Heart Room service appropriate? Yes; Fluid consistency: Thin  Skin:  Reviewed, no issues  Last BM:  7/24  Height:   Ht Readings from Last 1 Encounters:  12/25/15 5\' 3"  (1.6 m)    Weight:   Wt Readings from Last 1 Encounters:  12/25/15 94 lb (42.6 kg)    Ideal Body Weight:  56.36 kg (kg)  BMI:  Body mass index is 16.65  kg/m.  Estimated Nutritional Needs:   Kcal:  1500-1700 (35-40 kcal/kg)  Protein:  65-75 grams  Fluid:  >/= 1.5 L/day  EDUCATION NEEDS:   No education needs identified at this time    Trenton Gammon, MS, RD, LDN Inpatient Clinical Dietitian Pager # [phone] After hours/weekend pager # [phone]

## 2015-12-27 NOTE — Progress Notes (Signed)
MEDICATION RELATED CONSULT NOTE - INITIAL   Pharmacy Consult for Feraheme Indication: Anemia  No Known Allergies  Patient Measurements: Height: 5\' 3"  (160 cm) Weight: 94 lb (42.6 kg) IBW/kg (Calculated) : 56.9   Vital Signs: Temp: 97.7 F (36.5 C) (07/27 0454) Temp Source: Oral (07/27 0454) BP: 160/69 (07/27 0454) Pulse Rate: 55 (07/27 0454) Intake/Output from previous day: 07/26 0701 - 07/27 0700 In: 3279.3 [P.O.:120; I.V.:2131.7; Blood:977.7; IV Piggyback:50] Out: 550 [Urine:550] Intake/Output from this shift: No intake/output data recorded.  Labs:  Recent Labs  12/25/15 1818 12/26/15 0517 12/26/15 1528 12/27/15 0444  WBC 8.2 7.8 11.4* 10.3  HGB 7.1* 6.6* 11.0* 10.1*  HCT 21.8* 20.3* 32.4* 30.0*  PLT 425* 362 360 335  CREATININE 1.69* 1.34* 1.35* 1.11  MG  --  1.8 1.8 1.6*  PHOS  --  3.4  --   --   ALBUMIN 2.8*  --  2.3* 2.1*  PROT 6.5  --  5.3* 4.9*  AST 24  --  18 31  ALT 25  --  19 22  ALKPHOS 69  --  61 57  BILITOT 0.6  --  0.8 0.9   Estimated Creatinine Clearance: 40.5 mL/min (by C-G formula based on SCr of 1.11 mg/dL).   Microbiology: Recent Results (from the past 720 hour(s))  Blood Culture (routine x 2)     Status: None (Preliminary result)   Collection Time: 12/25/15  6:18 PM  Result Value Ref Range Status   Specimen Description BLOOD RIGHT FOREARM  Final   Special Requests BOTTLES DRAWN AEROBIC AND ANAEROBIC  Final   Culture   Final    NO GROWTH < 24 HOURS Performed at [facility]    Report Status PENDING  Incomplete  Blood Culture (routine x 2)     Status: None (Preliminary result)   Collection Time: 12/25/15  7:34 PM  Result Value Ref Range Status   Specimen Description BLOOD LEFT WRIST  Final   Special Requests BOTTLES DRAWN AEROBIC AND ANAEROBIC 5CC  Final   Culture   Final    NO GROWTH < 24 HOURS Performed at [facility]    Report Status PENDING  Incomplete    Medical History: Past Medical History:   Diagnosis Date  . Arthritis   . Hyperlipidemia        Assessment: 73 yoF with decreased appetite, weight loss and anemia.   Goal of Therapy:  Hgb=13-17  Plan:  Feraheme 510mg  IV x1 now F/u Hgb Reassess 3 to 8 days for additional dose  Lorenza Evangelist 12/27/2015,7:04 AM

## 2015-12-27 NOTE — Progress Notes (Signed)
PROGRESS NOTE                                                                                                                                                                                                             Patient Demographics:    Austin Ochoa, is a 65 y.o. male, DOB - 05/14/1951, XLK:[id number]  Admit date - 12/25/2015   Admitting Physician Delano Metz, MD  Outpatient Primary MD for the patient is No primary care provider on file.  LOS - 2  Chief Complaint  Patient presents with  . Shortness of Breath  . Fatigue       Brief Narrative    Austin Ochoa is a 65 y.o. male with no chronic medical conditions comes to ED with poor oral intake, Generalized weakness and mild productive cough along with low-grade fevers at home.  In ED has temp 102 deg and CXR shows emphysema w patchy infiltrates.  Asked to see for PNA admission.    Nephew provides history and also the patient.  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  Has been having problems with eating for over a month.  No appetite, and abd/ chest pain with or after eating.  No diarrhea.  +some nausea w vomiting.  Wt loss, not sure how much. Fatigued, +CP and upper back pain.  No prod cough.  No voiding issues.  ER MD did rectal exam returned brown stool, no masses.       Subjective:    Austin Ochoa today has, No headache, No chest pain, No abdominal pain - No Nausea, No new weakness tingling or numbness, No Mild Cough - no SOB.     Assessment  & Plan :     1. Bilateral CAP -  Stable, on Emp ABX, supportive Rx, HIV serology negative, cultures negative, clinically much improved, advance activity.  2. AOCD - An Panel inconclusive, check Occult blood in stool, 2 units PRBC on 12-26-15, table posttransfusion H&H.  3. Decreased appetite, weight  loss, anemia. Cachectic appearance. Suspicious for underlying malignancy, CT chest abdomen pelvis are nonacute except for large stool burden and bilateral pneumonia. Place on protein supplementation with outpatient PCP follow-up for outpatient workup for weight loss if it  continues. TSH was stable.  4. ARF due to dehydration. Soft after hydration and blood transfusion.  5. Mild metabolic acidosis due to number for above.  6. Mild hypothymia. Stable TSH and cortisol, resolved after supportive care. He was not septic.    Family Communication  :  Family bedside  Code Status :  Full  Diet : Heart Healthy  Disposition Plan  :  Discharge home 12/28/2015  Consults  :  None  Procedures  :   CT Chest-Abd-Pelvis - bilateral pneumonia and constipation  DVT Prophylaxis  :  Lovenox   Lab Results  Component Value Date   PLT 335 12/27/2015    Inpatient Medications  Scheduled Meds: . antiseptic oral rinse  7 mL Mouth Rinse q12n4p  . azithromycin  500 mg Intravenous Q24H  . bisacodyl  10 mg Rectal Daily  . carvedilol  3.125 mg Oral BID WC  . cefTRIAXone (ROCEPHIN)  IV  1 g Intravenous Q24H  . chlorhexidine  15 mL Mouth Rinse BID  . cyanocobalamin  1,000 mcg Intramuscular QHS  . docusate sodium  200 mg Oral BID  . enoxaparin (LOVENOX) injection  30 mg Subcutaneous Q24H  . feeding supplement (PRO-STAT SUGAR FREE 64)  30 mL Oral BID  . polyethylene glycol  17 g Oral BID   Continuous Infusions: .  sodium bicarbonate 150 mEq in sterile water 1000 mL infusion 150 mEq (12/26/15 1703)   PRN Meds:.diatrizoate meglumine-sodium, diphenhydrAMINE, guaiFENesin, hydrALAZINE, oxyCODONE-acetaminophen  Antibiotics  :    Anti-infectives    Start     Dose/Rate Route Frequency Ordered Stop   12/26/15 1800  azithromycin (ZITHROMAX) 500 mg in dextrose 5 % 250 mL IVPB     500 mg 250 mL/hr over 60 Minutes Intravenous Every 24 hours 12/26/15 0045 12/30/15 1759   12/26/15 1000  cefTRIAXone (ROCEPHIN) 1  g in dextrose 5 % 50 mL IVPB     1 g 100 mL/hr over 30 Minutes Intravenous Every 24 hours 12/26/15 0045 01/01/16 0959   12/25/15 1900  cefTRIAXone (ROCEPHIN) 1 g in dextrose 5 % 50 mL IVPB     1 g 100 mL/hr over 30 Minutes Intravenous  Once 12/25/15 1855 12/25/15 1950   12/25/15 1900  azithromycin (ZITHROMAX) 500 mg in dextrose 5 % 250 mL IVPB     500 mg 250 mL/hr over 60 Minutes Intravenous  Once 12/25/15 1855 12/25/15 2011         Objective:   Vitals:   12/26/15 1347 12/26/15 2116 12/27/15 0454 12/27/15 0930  BP: 128/73 (!) 147/68 (!) 160/69 (!) 150/73  Pulse: 63 (!) 54 (!) 55 (!) 52  Resp: 16 16 14 16   Temp: 97.6 F (36.4 C) 98.6 F (37 C) 97.7 F (36.5 C) 98.2 F (36.8 C)  TempSrc: Oral Oral Oral Oral  SpO2: 98% 97% 94% 100%  Weight:      Height:        Wt Readings from Last 3 Encounters:  12/25/15 42.6 kg (94 lb)     Intake/Output Summary (Last 24 hours) at 12/27/15 1006 Last data filed at 12/27/15 0600  Gross per 24 hour  Intake          2944.34 ml  Output              150 ml  Net          2794.34 ml     Physical Exam  Awake Alert, Oriented X 3, No new F.N deficits, Normal affect [place].AT,PERRAL  Supple Neck,No JVD, No cervical lymphadenopathy appriciated.  Symmetrical Chest wall movement, Good air movement bilaterally, CTAB RRR,No Gallops,Rubs or new Murmurs, No Parasternal Heave +ve B.Sounds, Abd Soft, No tenderness, No organomegaly appriciated, No rebound - guarding or rigidity. No Cyanosis, Clubbing or edema, No new Rash or bruise       Data Review:    CBC  Recent Labs Lab 12/25/15 1818 12/26/15 0517 12/26/15 1528 12/27/15 0444  WBC 8.2 7.8 11.4* 10.3  HGB 7.1* 6.6* 11.0* 10.1*  HCT 21.8* 20.3* 32.4* 30.0*  PLT 425* 362 360 335  MCV 84.2 84.6 82.0 80.9  MCH 27.4 27.5 27.8 27.2  MCHC 32.6 32.5 34.0 33.7  RDW 15.8* 15.9* 15.3 15.8*  LYMPHSABS 0.5*  --   --   --   MONOABS 0.3  --   --   --   EOSABS 0.0  --   --   --   BASOSABS 0.0  --    --   --     Chemistries   Recent Labs Lab 12/25/15 1818 12/26/15 0517 12/26/15 1528 12/27/15 0444  NA 140 142 136 140  K 4.8 4.4 3.8 3.5  CL 112* 120* 108 108  CO2 18* 17* 21* 25  GLUCOSE 162* 140* 156* 106*  BUN 50* 38* 35* 28*  CREATININE 1.69* 1.34* 1.35* 1.11  CALCIUM 8.5* 7.2* 7.6* 7.7*  MG  --  1.8 1.8 1.6*  AST 24  --  18 31  ALT 25  --  19 22  ALKPHOS 69  --  61 57  BILITOT 0.6  --  0.8 0.9   ------------------------------------------------------------------------------------------------------------------ No results for input(s): CHOL, HDL, LDLCALC, TRIG, CHOLHDL, LDLDIRECT in the last 72 hours.  No results found for: HGBA1C ------------------------------------------------------------------------------------------------------------------  Recent Labs  12/26/15 0517  TSH 0.516   ------------------------------------------------------------------------------------------------------------------  Recent Labs  12/26/15 0517  VITAMINB12 196  FOLATE 11.5  FERRITIN 69  TIBC 169*  IRON <5*  RETICCTPCT 1.3    Coagulation profile No results for input(s): INR, PROTIME in the last 168 hours.  No results for input(s): DDIMER in the last 72 hours.  Cardiac Enzymes No results for input(s): CKMB, TROPONINI, MYOGLOBIN in the last 168 hours.  Invalid input(s): CK ------------------------------------------------------------------------------------------------------------------ No results found for: BNP  Micro Results Recent Results (from the past 240 hour(s))  Blood Culture (routine x 2)     Status: None (Preliminary result)   Collection Time: 12/25/15  6:18 PM  Result Value Ref Range Status   Specimen Description BLOOD RIGHT FOREARM  Final   Special Requests BOTTLES DRAWN AEROBIC AND ANAEROBIC  Final   Culture   Final    NO GROWTH < 24 HOURS Performed at [facility]    Report Status PENDING  Incomplete  Blood Culture (routine x 2)     Status:  None (Preliminary result)   Collection Time: 12/25/15  7:34 PM  Result Value Ref Range Status   Specimen Description BLOOD LEFT WRIST  Final   Special Requests BOTTLES DRAWN AEROBIC AND ANAEROBIC 5CC  Final   Culture   Final    NO GROWTH < 24 HOURS Performed at [facility]    Report Status PENDING  Incomplete    Radiology Reports Dg Chest 2 View  Result Date: 12/25/2015 CLINICAL DATA:  Fever, lethargy and shortness of breath. EXAM: CHEST  2 VIEW COMPARISON:  None. FINDINGS: Heart size is normal. Mediastinal shadows are normal. There is bilateral lower lobe bronchopneumonia left worse than  right. Upper lobes probably show emphysema. No demonstrable effusion. No acute bone finding. IMPRESSION: Bilateral lower lobe pneumonia left worse than right. Probable underlying emphysema. Electronically Signed   By: Paulina Fusi M.D.   On: 12/25/2015 19:37  Ct Chest W Contrast  Result Date: 12/26/2015 CLINICAL DATA:  Left lower quadrant pain, weight loss, fever EXAM: CT CHEST, ABDOMEN, AND PELVIS WITH CONTRAST TECHNIQUE: Multidetector CT imaging of the chest, abdomen and pelvis was performed following the standard protocol during bolus administration of intravenous contrast. CONTRAST:  ISOVUE-300 IOPAMIDOL (ISOVUE-300) INJECTION 61% COMPARISON:  None. FINDINGS: CT CHEST On lung window images, there is extensive opacification throughout much of the left lower lobes most consistent with severe low lower lobe pneumonia. Air bronchograms do course through these of opacities. No definite mass is seen. There are bilateral pleural effusions present of a moderate degree. Some of the opacity also appears due to atelectasis. Minimal opacity is noted within the right upper lobe as well most consistent with multifocal pneumonia with lesser involvement of the left upper lobe. There is occlusion of the bronchi to the lower lobes possibly due to inspissated mucus, with bilateral endobronchial lesions less  likely. Followup is recommended to ensure clearing. On soft tissue window images, the mid ascending thoracic aorta measures 34 mm in diameter. The origins of the great vessels appear patent. The heart is within upper limits of normal. No pericardial effusion is seen. No mediastinal or hilar adenopathy is seen. The pulmonary arteries are not well opacified but no central abnormality is evident. The thoracic vertebrae are normal alignment although there is diffuse degenerative change present. CT ABDOMEN AND PELVIS The liver enhances with no focal abnormality and no ductal dilatation is seen. No calcified gallstones are seen within the slightly distended gallbladder. The pancreas is normal in size and the pancreatic duct is not dilated. The does appear to be some ascites present. The adrenal glands and spleen are unremarkable. The stomach is moderately distended with oral contrast with no abnormality noted. The kidneys enhance and there are bilateral renal cysts present. On delayed images, the pelvocaliceal systems are unremarkable and the proximal ureters are normal in caliber. The abdominal aorta is normal in caliber. The urinary bladder is moderately urine distended with no abnormality noted. The prostate is normal in size. There is a moderate amount of feces throughout the colon. No explanation for the patient's left lower quadrant pain is seen other than the presence of a moderate amount of feces throughout the rectosigmoid colon. No inflammatory process is seen. The lumbar vertebrae are in normal alignment with diffuse degenerative spurring present. No compression deformity is seen. IMPRESSION: 1. Extensive opacification throughout the lungs primarily involving the lower lobes with air bronchograms most consistent with diffuse pneumonia with moderate-sized bilateral pleural effusions. 2. Possible inspissated mucus within the bronchi to the lower lobes. No mediastinal or hilar adenopathy is seen. 3. Small amount  of ascites is present within the upper abdomen. 4. No definite explanation for the patient's left lower quadrant pain is seen other than the presence of a moderate amount of feces throughout the rectosigmoid colon. Electronically Signed   By: Dwyane Dee M.D.   On: 12/26/2015 11:36  Ct Abdomen Pelvis W Contrast  Result Date: 12/26/2015 CLINICAL DATA:  Left lower quadrant pain, weight loss, fever EXAM: CT CHEST, ABDOMEN, AND PELVIS WITH CONTRAST TECHNIQUE: Multidetector CT imaging of the chest, abdomen and pelvis was performed following the standard protocol during bolus administration of intravenous contrast. CONTRAST:  ISOVUE-300 IOPAMIDOL (ISOVUE-300) INJECTION 61% COMPARISON:  None. FINDINGS: CT CHEST On lung window images, there is extensive opacification throughout much of the left lower lobes most consistent with severe low lower lobe pneumonia. Air bronchograms do course through these of opacities. No definite mass is seen. There are bilateral pleural effusions present of a moderate degree. Some of the opacity also appears due to atelectasis. Minimal opacity is noted within the right upper lobe as well most consistent with multifocal pneumonia with lesser involvement of the left upper lobe. There is occlusion of the bronchi to the lower lobes possibly due to inspissated mucus, with bilateral endobronchial lesions less likely. Followup is recommended to ensure clearing. On soft tissue window images, the mid ascending thoracic aorta measures 34 mm in diameter. The origins of the great vessels appear patent. The heart is within upper limits of normal. No pericardial effusion is seen. No mediastinal or hilar adenopathy is seen. The pulmonary arteries are not well opacified but no central abnormality is evident. The thoracic vertebrae are normal alignment although there is diffuse degenerative change present. CT ABDOMEN AND PELVIS The liver enhances with no focal abnormality and no ductal dilatation is  seen. No calcified gallstones are seen within the slightly distended gallbladder. The pancreas is normal in size and the pancreatic duct is not dilated. The does appear to be some ascites present. The adrenal glands and spleen are unremarkable. The stomach is moderately distended with oral contrast with no abnormality noted. The kidneys enhance and there are bilateral renal cysts present. On delayed images, the pelvocaliceal systems are unremarkable and the proximal ureters are normal in caliber. The abdominal aorta is normal in caliber. The urinary bladder is moderately urine distended with no abnormality noted. The prostate is normal in size. There is a moderate amount of feces throughout the colon. No explanation for the patient's left lower quadrant pain is seen other than the presence of a moderate amount of feces throughout the rectosigmoid colon. No inflammatory process is seen. The lumbar vertebrae are in normal alignment with diffuse degenerative spurring present. No compression deformity is seen. IMPRESSION: 1. Extensive opacification throughout the lungs primarily involving the lower lobes with air bronchograms most consistent with diffuse pneumonia with moderate-sized bilateral pleural effusions. 2. Possible inspissated mucus within the bronchi to the lower lobes. No mediastinal or hilar adenopathy is seen. 3. Small amount of ascites is present within the upper abdomen. 4. No definite explanation for the patient's left lower quadrant pain is seen other than the presence of a moderate amount of feces throughout the rectosigmoid colon. Electronically Signed   By: Dwyane Dee M.D.   On: 12/26/2015 11:36   Time Spent in minutes  30   Nyasiah Moffet K M.D on 12/27/2015 at 10:06 AM  Between 7am to 7pm - Pager - [phone]  After 7pm go to [email or web address] - password [facility]  [facility] -  Office  [phone]

## 2015-12-28 ENCOUNTER — Encounter (HOSPITAL_COMMUNITY): Payer: Self-pay | Admitting: General Surgery

## 2015-12-28 DIAGNOSIS — E43 Unspecified severe protein-calorie malnutrition: Secondary | ICD-10-CM | POA: Insufficient documentation

## 2015-12-28 LAB — HEMOGLOBIN AND HEMATOCRIT, BLOOD
HEMATOCRIT: 31.3 % — AB (ref 39.0–52.0)
Hemoglobin: 10.4 g/dL — ABNORMAL LOW (ref 13.0–17.0)

## 2015-12-28 MED ORDER — AMLODIPINE BESYLATE 10 MG PO TABS: 10.0000 mg | ORAL_TABLET | Freq: Every day | ORAL | 0 refills | Status: AC

## 2015-12-28 MED ORDER — POLYETHYLENE GLYCOL 3350 17 G PO PACK: 17.0000 g | PACK | Freq: Every day | ORAL | 0 refills | Status: AC | PRN

## 2015-12-28 MED ORDER — GUAIFENESIN-DM 100-10 MG/5ML PO SYRP: 5.0000 mL | ORAL_SOLUTION | ORAL | 0 refills | Status: AC | PRN

## 2015-12-28 MED ORDER — PRO-STAT SUGAR FREE PO LIQD: 30.0000 mL | Freq: Two times a day (BID) | ORAL | 0 refills | Status: AC

## 2015-12-28 MED ORDER — AMLODIPINE BESYLATE 10 MG PO TABS
10.0000 mg | ORAL_TABLET | Freq: Every day | ORAL | Status: DC
  Administered 2015-12-28: 10 mg via ORAL
  Filled 2015-12-28: qty 1

## 2015-12-28 MED ORDER — CARVEDILOL 3.125 MG PO TABS: 3.1250 mg | ORAL_TABLET | Freq: Two times a day (BID) | ORAL | 0 refills | Status: AC

## 2015-12-28 MED ORDER — LEVOFLOXACIN 750 MG PO TABS: 750.0000 mg | ORAL_TABLET | Freq: Every day | ORAL | 0 refills | Status: AC

## 2015-12-28 NOTE — Progress Notes (Signed)
Discussed discharge instructions and medications with patient. Prescriptions were given. Son and daughter in law were at bedside. No further questions from patient and family.   Rennie Plowman, RN

## 2015-12-28 NOTE — Progress Notes (Signed)
Patient provided forms to obtain medical records.  Patient daughter to fill out forms and return to medical records

## 2015-12-30 LAB — TYPE AND SCREEN
ABO/RH(D): O POS
Antibody Screen: NEGATIVE
Unit division: 0
Unit division: 0
Unit division: 0

## 2015-12-30 LAB — CULTURE, BLOOD (ROUTINE X 2)
CULTURE: NO GROWTH
Culture: NO GROWTH

## 2016-01-04 NOTE — Discharge Summary (Signed)
Austin Ochoa ZOX:[id number] DOB: 1951-02-06 DOA: 12/25/2015  PCP: No primary care provider on file.  Admit date: 12/25/2015  Discharge date: 01/04/2016  Admitted From: Home   Disposition:  Home - [place]   Recommendations for Outpatient Follow-up:   Follow up with PCP in 1-2 weeks  PCP Please obtain BMP/CBC, 2 view CXR in 1week,  (see Discharge instructions)   PCP Please follow up on the following pending results: None   Home Health: None   Equipment/Devices: None  Consultations: None Discharge Condition: Stable   CODE STATUS: Full   Diet Recommendation: Heart Healthy   Chief Complaint  Patient presents with  . Shortness of Breath  . Fatigue     Brief history of present illness from the day of admission and additional interim summary    Austin Rodriguezis a 65 y.o.malewith no chronic medical conditions comes to ED with poor oral intake, Generalized weakness and mild productive cough along with low-grade fevers at home. In ED has temp 102 deg and CXR shows emphysema w patchy infiltrates. Asked to see for PNA admission.   Nephew provides history and also the patient. 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 Has been having problems with eating for over a month. No appetite, and abd/ chest pain with or after eating. No diarrhea. +some nausea w vomiting. Wt loss, not sure how much. Fatigued, +CP and upper back pain. No prod cough. No voiding issues. ER MD did rectal exam returned brown stool, no masses.   Hospital issues addressed    1. Bilateral CAP -  Stable, on Emp ABX, supportive Rx, HIV serology negative, cultures negative, clinically much improved, placed on PO ABX and DC'd home, now  going to [place] with son tomorrow.  2. AOCD - Anaemia Panel inconclusive, he got 2 units PRBC on 12-26-15, table posttransfusion H&H. Outpt age appropriate Anemia workup  3. Decreased appetite, weight loss, anemia. Cachectic appearance. Suspicious for underlying malignancy, CT chest abdomen pelvis are nonacute except for large stool burden and bilateral pneumonia. Place on protein supplementation with outpatient PCP follow-up for outpatient workup for weight loss if it continues. TSH was stable.  4. ARF due to dehydration. Resolved after IVF and PRBCs.  5. Mild metabolic acidosis due to number for above. Resolved   6. Mild hypothymia. Stable TSH and cortisol, resolved after supportive care. He was not septic.   Discharge diagnosis     Principal Problem:   CAP (community acquired pneumonia) Active Problems:   Volume depletion   Weight loss   Renal insufficiency   Metabolic acidosis   Anemia, normocytic normochromic   Protein-calorie malnutrition, severe    Discharge instructions      Discharge Medications     Medication List    STOP taking these medications   THERAFLU COLD/COUGH DAYTIME PO     TAKE these medications   amLODipine 10 MG tablet Commonly known as:  NORVASC  Take 1 tablet (10 mg total) by mouth daily.   carvedilol 3.125 MG tablet Commonly known as:  COREG Take 1 tablet (3.125 mg total) by mouth 2 (two) times daily with a meal.   feeding supplement (PRO-STAT SUGAR FREE 64) Liqd Take 30 mLs by mouth 2 (two) times daily.   guaiFENesin-dextromethorphan 100-10 MG/5ML syrup Commonly known as:  ROBITUSSIN DM Take 5 mLs by mouth every 4 (four) hours as needed for cough.   levofloxacin 750 MG tablet Commonly known as:  LEVAQUIN Take 1 tablet (750 mg total) by mouth daily.   polyethylene glycol packet Commonly known as:  MIRALAX / GLYCOLAX Take 17 g by mouth daily as needed.         Major procedures and Radiology Reports - PLEASE review  detailed and final reports thoroughly  -        Dg Chest 2 View  Result Date: 12/25/2015 CLINICAL DATA:  Fever, lethargy and shortness of breath. EXAM: CHEST  2 VIEW COMPARISON:  None. FINDINGS: Heart size is normal. Mediastinal shadows are normal. There is bilateral lower lobe bronchopneumonia left worse than right. Upper lobes probably show emphysema. No demonstrable effusion. No acute bone finding. IMPRESSION: Bilateral lower lobe pneumonia left worse than right. Probable underlying emphysema. Electronically Signed   By: Paulina Fusi M.D.   On: 12/25/2015 19:37  Ct Chest W Contrast  Result Date: 12/26/2015 CLINICAL DATA:  Left lower quadrant pain, weight loss, fever EXAM: CT CHEST, ABDOMEN, AND PELVIS WITH CONTRAST TECHNIQUE: Multidetector CT imaging of the chest, abdomen and pelvis was performed following the standard protocol during bolus administration of intravenous contrast. CONTRAST:  ISOVUE-300 IOPAMIDOL (ISOVUE-300) INJECTION 61% COMPARISON:  None. FINDINGS: CT CHEST On lung window images, there is extensive opacification throughout much of the left lower lobes most consistent with severe low lower lobe pneumonia. Air bronchograms do course through these of opacities. No definite mass is seen. There are bilateral pleural effusions present of a moderate degree. Some of the opacity also appears due to atelectasis. Minimal opacity is noted within the right upper lobe as well most consistent with multifocal pneumonia with lesser involvement of the left upper lobe. There is occlusion of the bronchi to the lower lobes possibly due to inspissated mucus, with bilateral endobronchial lesions less likely. Followup is recommended to ensure clearing. On soft tissue window images, the mid ascending thoracic aorta measures 34 mm in diameter. The origins of the great vessels appear patent. The heart is within upper limits of normal. No pericardial effusion is seen. No mediastinal or hilar adenopathy is  seen. The pulmonary arteries are not well opacified but no central abnormality is evident. The thoracic vertebrae are normal alignment although there is diffuse degenerative change present. CT ABDOMEN AND PELVIS The liver enhances with no focal abnormality and no ductal dilatation is seen. No calcified gallstones are seen within the slightly distended gallbladder. The pancreas is normal in size and the pancreatic duct is not dilated. The does appear to be some ascites present. The adrenal glands and spleen are unremarkable. The stomach is moderately distended with oral contrast with no abnormality noted. The kidneys enhance and there are bilateral renal cysts present. On delayed images, the pelvocaliceal systems are unremarkable and the proximal ureters are normal in caliber. The abdominal aorta is normal in caliber. The urinary bladder is moderately urine distended with no abnormality noted. The prostate is normal in size. There is a moderate amount of feces throughout the colon. No explanation for  the patient's left lower quadrant pain is seen other than the presence of a moderate amount of feces throughout the rectosigmoid colon. No inflammatory process is seen. The lumbar vertebrae are in normal alignment with diffuse degenerative spurring present. No compression deformity is seen. IMPRESSION: 1. Extensive opacification throughout the lungs primarily involving the lower lobes with air bronchograms most consistent with diffuse pneumonia with moderate-sized bilateral pleural effusions. 2. Possible inspissated mucus within the bronchi to the lower lobes. No mediastinal or hilar adenopathy is seen. 3. Small amount of ascites is present within the upper abdomen. 4. No definite explanation for the patient's left lower quadrant pain is seen other than the presence of a moderate amount of feces throughout the rectosigmoid colon. Electronically Signed   By: Dwyane Dee M.D.   On: 12/26/2015 11:36  Ct Abdomen Pelvis W  Contrast  Result Date: 12/26/2015 CLINICAL DATA:  Left lower quadrant pain, weight loss, fever EXAM: CT CHEST, ABDOMEN, AND PELVIS WITH CONTRAST TECHNIQUE: Multidetector CT imaging of the chest, abdomen and pelvis was performed following the standard protocol during bolus administration of intravenous contrast. CONTRAST:  ISOVUE-300 IOPAMIDOL (ISOVUE-300) INJECTION 61% COMPARISON:  None. FINDINGS: CT CHEST On lung window images, there is extensive opacification throughout much of the left lower lobes most consistent with severe low lower lobe pneumonia. Air bronchograms do course through these of opacities. No definite mass is seen. There are bilateral pleural effusions present of a moderate degree. Some of the opacity also appears due to atelectasis. Minimal opacity is noted within the right upper lobe as well most consistent with multifocal pneumonia with lesser involvement of the left upper lobe. There is occlusion of the bronchi to the lower lobes possibly due to inspissated mucus, with bilateral endobronchial lesions less likely. Followup is recommended to ensure clearing. On soft tissue window images, the mid ascending thoracic aorta measures 34 mm in diameter. The origins of the great vessels appear patent. The heart is within upper limits of normal. No pericardial effusion is seen. No mediastinal or hilar adenopathy is seen. The pulmonary arteries are not well opacified but no central abnormality is evident. The thoracic vertebrae are normal alignment although there is diffuse degenerative change present. CT ABDOMEN AND PELVIS The liver enhances with no focal abnormality and no ductal dilatation is seen. No calcified gallstones are seen within the slightly distended gallbladder. The pancreas is normal in size and the pancreatic duct is not dilated. The does appear to be some ascites present. The adrenal glands and spleen are unremarkable. The stomach is moderately distended with oral contrast with no  abnormality noted. The kidneys enhance and there are bilateral renal cysts present. On delayed images, the pelvocaliceal systems are unremarkable and the proximal ureters are normal in caliber. The abdominal aorta is normal in caliber. The urinary bladder is moderately urine distended with no abnormality noted. The prostate is normal in size. There is a moderate amount of feces throughout the colon. No explanation for the patient's left lower quadrant pain is seen other than the presence of a moderate amount of feces throughout the rectosigmoid colon. No inflammatory process is seen. The lumbar vertebrae are in normal alignment with diffuse degenerative spurring present. No compression deformity is seen. IMPRESSION: 1. Extensive opacification throughout the lungs primarily involving the lower lobes with air bronchograms most consistent with diffuse pneumonia with moderate-sized bilateral pleural effusions. 2. Possible inspissated mucus within the bronchi to the lower lobes. No mediastinal or hilar adenopathy is seen. 3. Small amount  of ascites is present within the upper abdomen. 4. No definite explanation for the patient's left lower quadrant pain is seen other than the presence of a moderate amount of feces throughout the rectosigmoid colon. Electronically Signed   By: Dwyane Dee M.D.   On: 12/26/2015 11:36   Micro Results     Recent Results (from the past 240 hour(s))  Blood Culture (routine x 2)     Status: None   Collection Time: 12/25/15  6:18 PM  Result Value Ref Range Status   Specimen Description BLOOD RIGHT FOREARM  Final   Special Requests BOTTLES DRAWN AEROBIC AND ANAEROBIC  Final   Culture   Final    NO GROWTH 5 DAYS Performed at [facility]    Report Status 12/30/2015 FINAL  Final  Blood Culture (routine x 2)     Status: None   Collection Time: 12/25/15  7:34 PM  Result Value Ref Range Status   Specimen Description BLOOD LEFT WRIST  Final   Special Requests BOTTLES  DRAWN AEROBIC AND ANAEROBIC 5CC  Final   Culture   Final    NO GROWTH 5 DAYS Performed at [facility]    Report Status 12/30/2015 FINAL  Final  Urine culture     Status: Abnormal   Collection Time: 12/25/15  9:56 PM  Result Value Ref Range Status   Specimen Description URINE, RANDOM  Final   Special Requests NONE  Final   Culture MULTIPLE SPECIES PRESENT, SUGGEST RECOLLECTION (A)  Final   Report Status 12/27/2015 FINAL  Final    Today   Subjective    Austin Ochoa today has no headache,no chest abdominal pain,no new weakness tingling or numbness, feels much better wants to go home today.    Objective   Blood pressure (!) 148/81, pulse (!) 56, temperature 97.5 F (36.4 C), temperature source Axillary, resp. rate 16, height 5\' 3"  (1.6 m), weight 42.6 kg (94 lb), SpO2 92 %.  No intake or output data in the 24 hours ending 01/04/16 1553  Exam Awake Alert, Oriented x 3, No new F.N deficits, Normal affect [place].AT,PERRAL Supple Neck,No JVD, No cervical lymphadenopathy appriciated.  Symmetrical Chest wall movement, Good air movement bilaterally, CTAB RRR,No Gallops,Rubs or new Murmurs, No Parasternal Heave +ve B.Sounds, Abd Soft, Non tender, No organomegaly appriciated, No rebound -guarding or rigidity. No Cyanosis, Clubbing or edema, No new Rash or bruise   Data Review   CBC w Diff:  Lab Results  Component Value Date   WBC 10.3 12/27/2015   HGB 10.4 (L) 12/28/2015   HCT 31.3 (L) 12/28/2015   PLT 335 12/27/2015   LYMPHOPCT 6 12/25/2015   MONOPCT 4 12/25/2015   EOSPCT 0 12/25/2015   BASOPCT 0 12/25/2015    CMP:  Lab Results  Component Value Date   NA 140 12/27/2015   K 3.5 12/27/2015   CL 108 12/27/2015   CO2 25 12/27/2015   BUN 28 (H) 12/27/2015   CREATININE 1.11 12/27/2015   PROT 4.9 (L) 12/27/2015   ALBUMIN 2.1 (L) 12/27/2015   BILITOT 0.9 12/27/2015   ALKPHOS 57 12/27/2015   AST 31 12/27/2015   ALT 22 12/27/2015  .   Total Time in preparing  paper work, data evaluation and todays exam - 35 minutes  Leroy Sea M.D on 01/04/2016 at 3:53 PM  [facility]   Office  [phone]

## 2018-04-10 IMAGING — CR DG CHEST 2V
2 series · 2 of 2 positions shown · non-contrast
Comparison: None.

CLINICAL DATA: Fever, lethargy and shortness of breath.

EXAM:
CHEST  2 VIEW

[w chest lat]
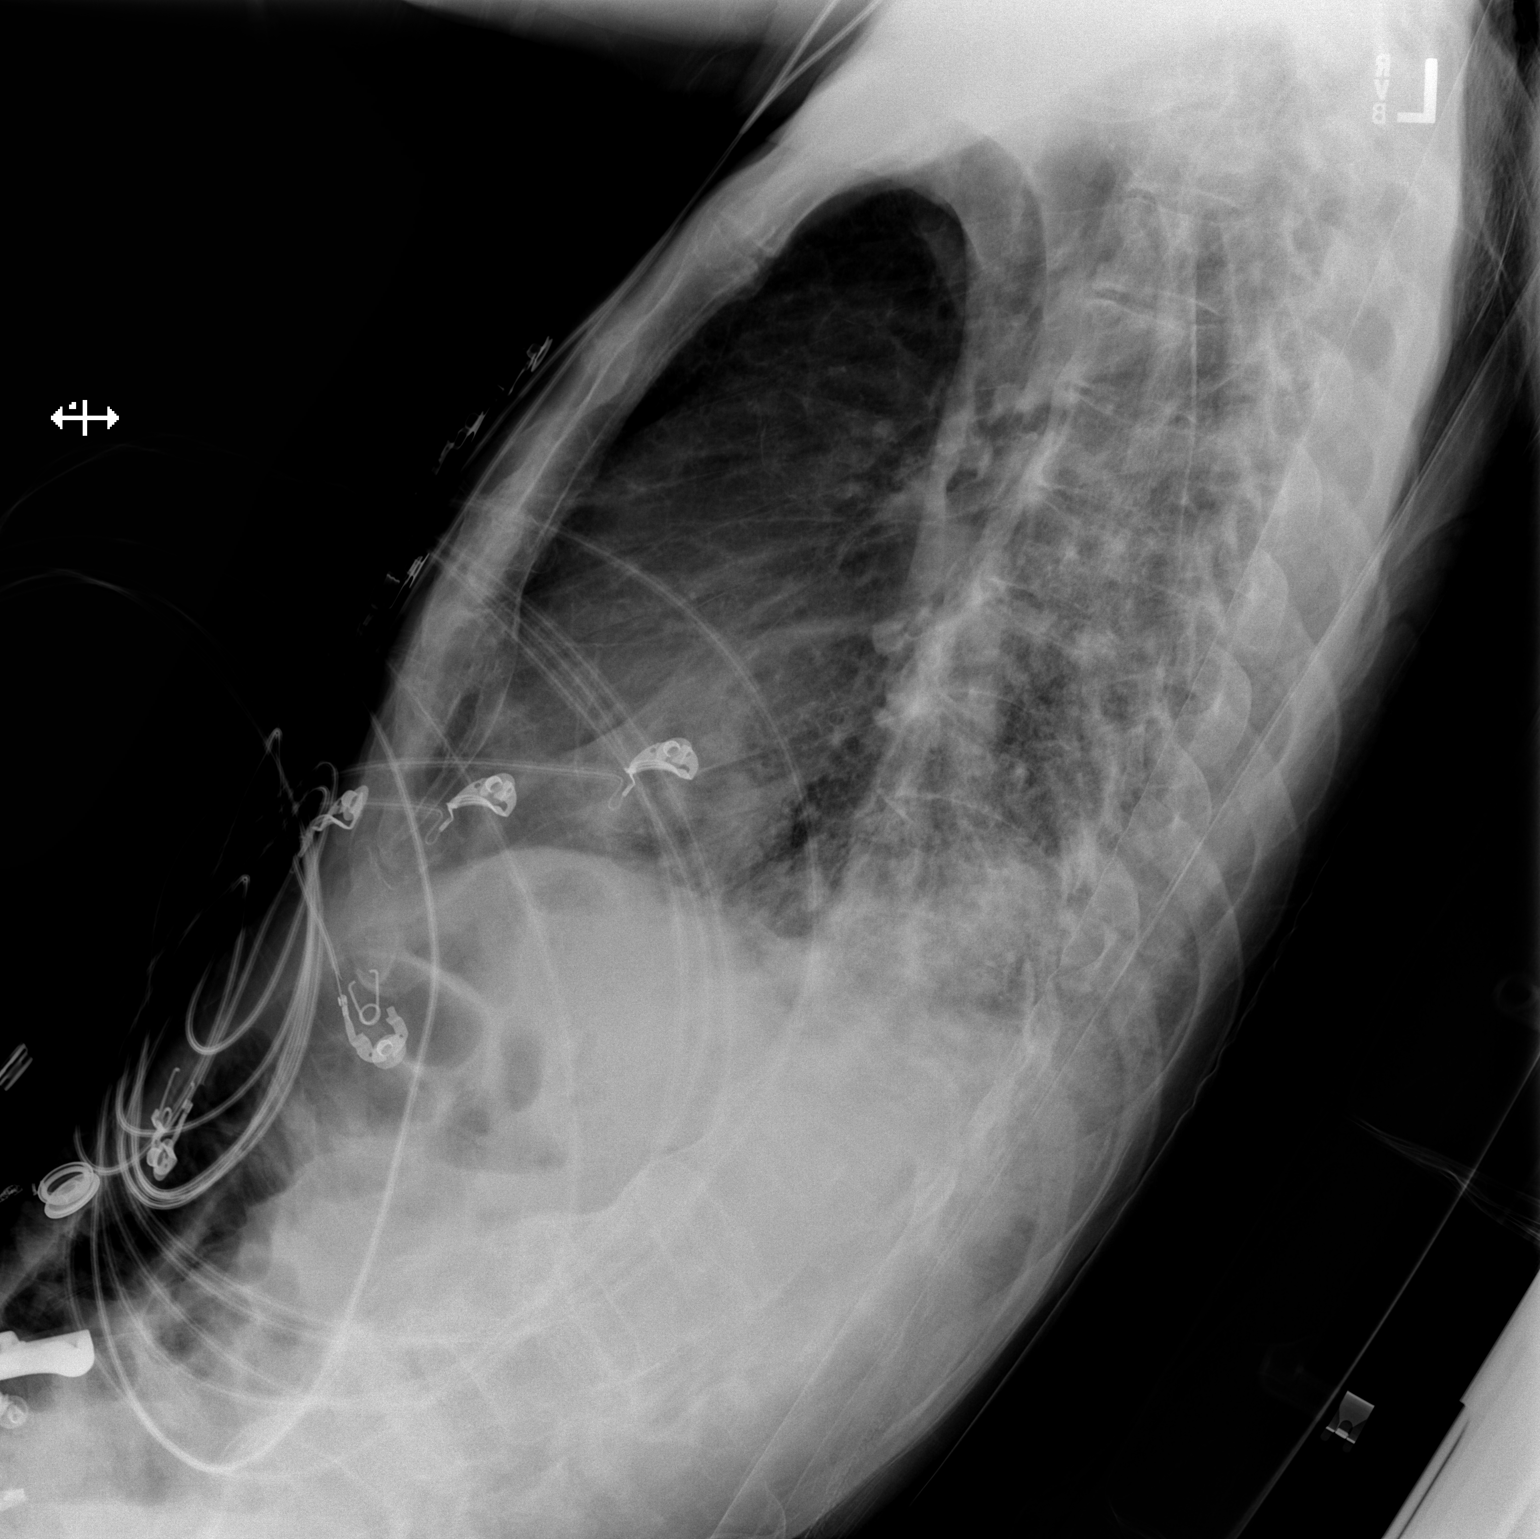

[x chest ap]
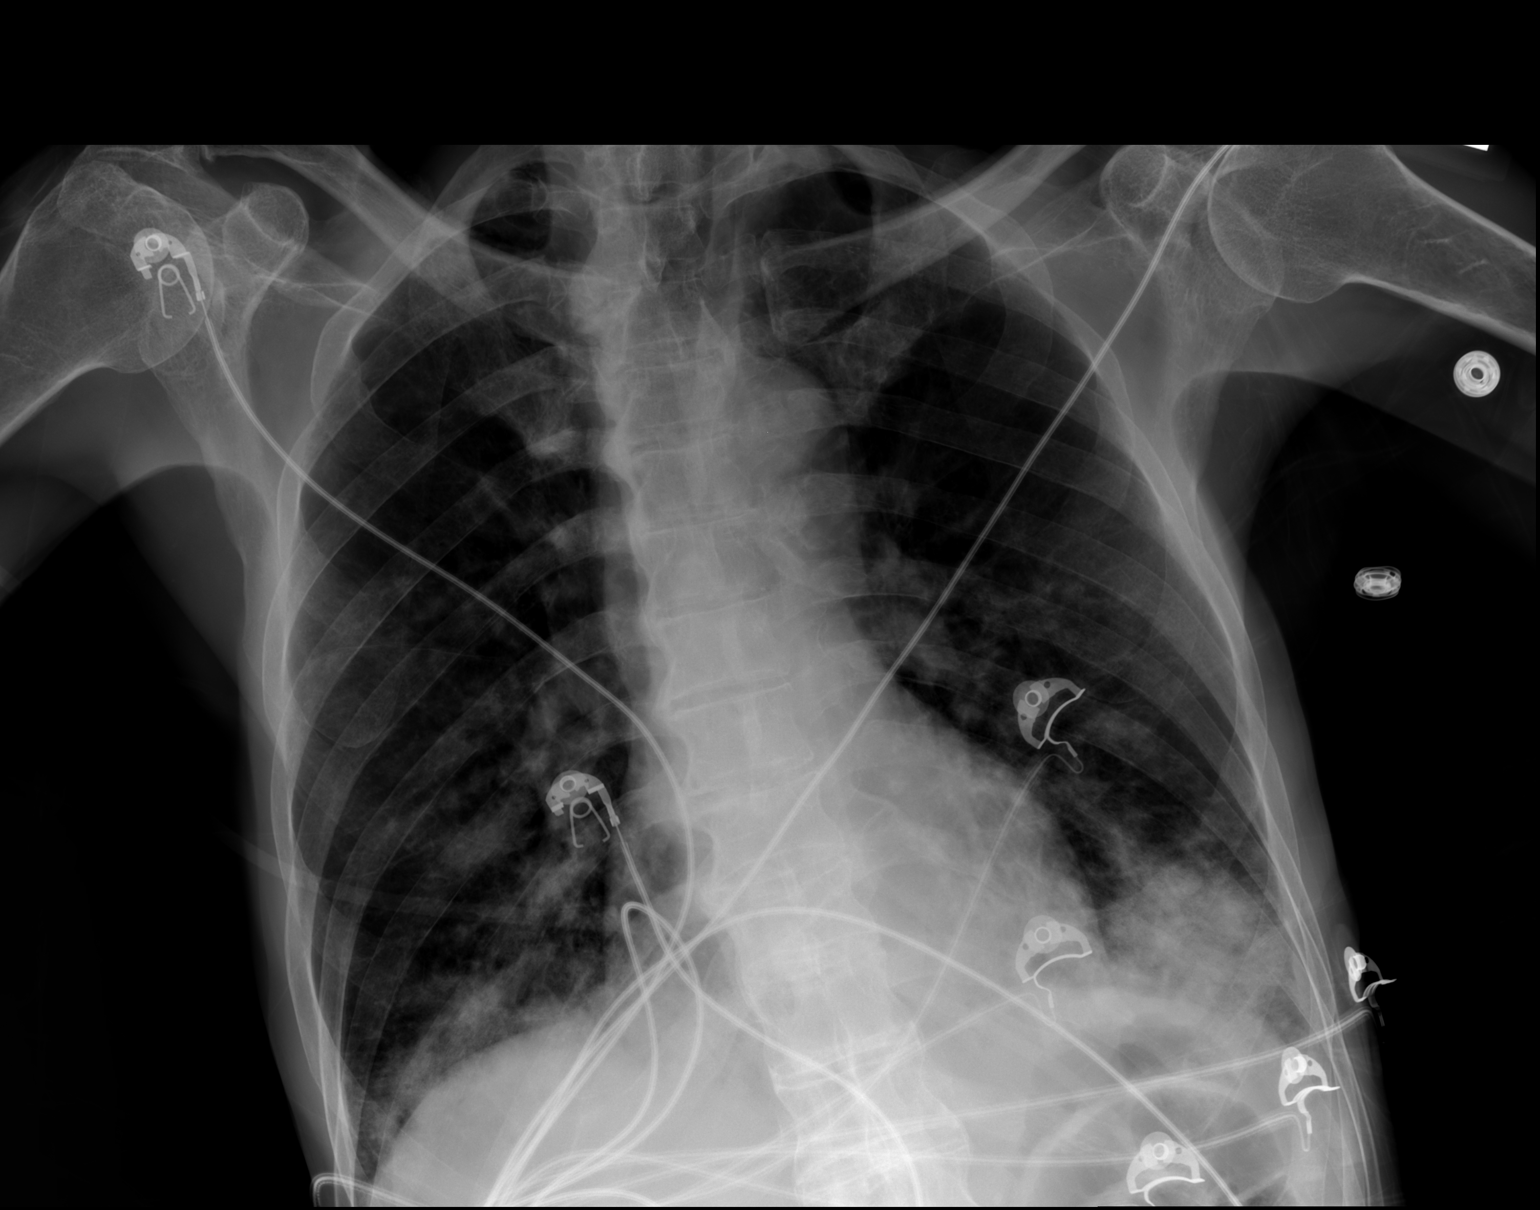

[2 of 2 positions shown; findings below may reference images not displayed]

FINDINGS: Heart size is normal. Mediastinal shadows are normal. There is
bilateral lower lobe bronchopneumonia left worse than right. Upper
lobes probably show emphysema. No demonstrable effusion. No acute
bone finding.
IMPRESSION: Bilateral lower lobe pneumonia left worse than right. Probable
underlying emphysema.

## 2018-04-11 IMAGING — CT CT CHEST W/ CM
2 of 5 series · 15 of 46 positions shown, 17 images · IV contrast (ISOVUE)
Comparison: None.

CLINICAL DATA: Left lower quadrant pain, weight loss, fever

EXAM:
CT CHEST, ABDOMEN, AND PELVIS WITH CONTRAST
TECHNIQUE: Multidetector CT imaging of the chest, abdomen and pelvis was
performed following the standard protocol during bolus
administration of intravenous contrast.
CONTRAST:  100mL UAG4P2-FZZ IOPAMIDOL (UAG4P2-FZZ) INJECTION 61%

[Series 2: cap with st · axial · 0.81mm/px · z∈[-556,-36]mm · 12 of 124 slices shown, 14 images]
[im 10/124  soft-tissue]
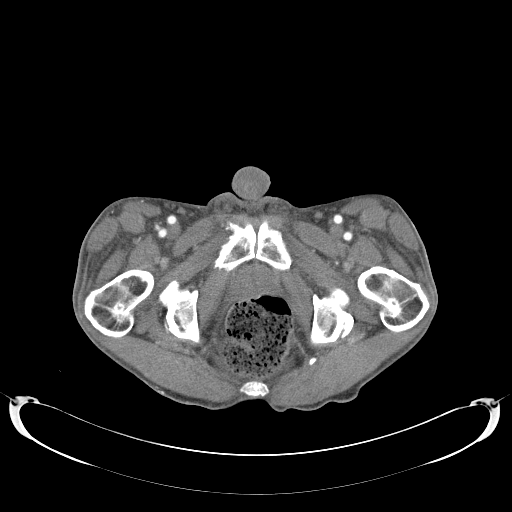
[im 10/124  bone]
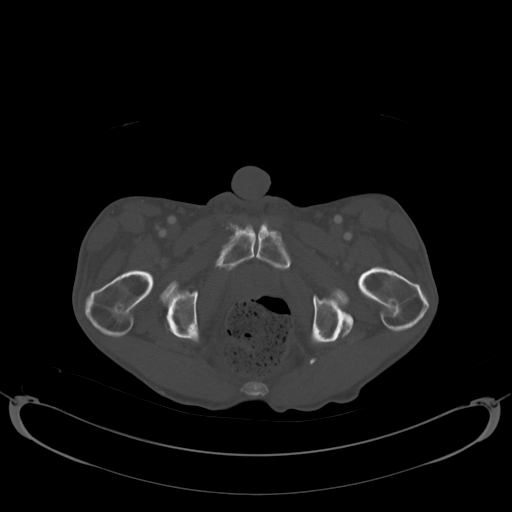
[im 19/124  soft-tissue]
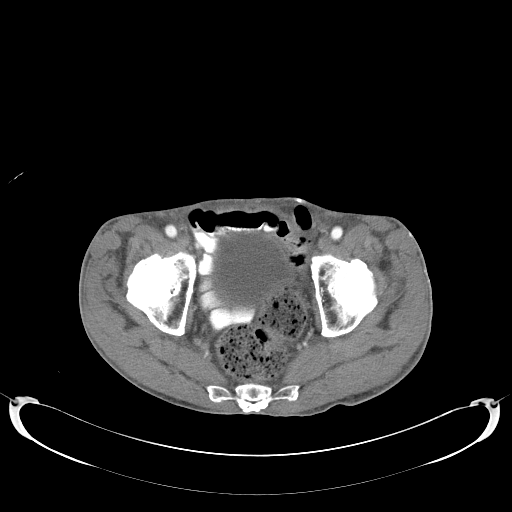
[im 29/124  soft-tissue]
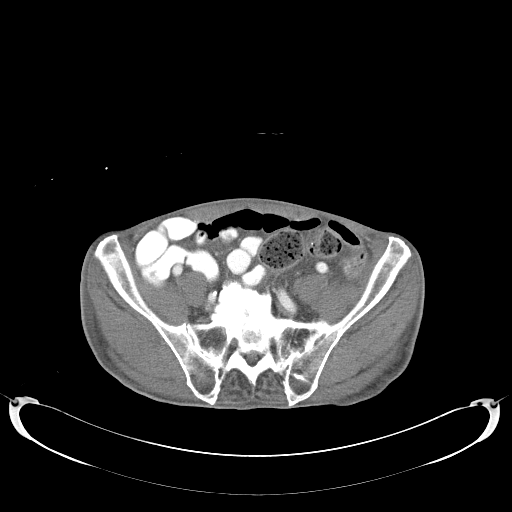
[im 38/124  soft-tissue]
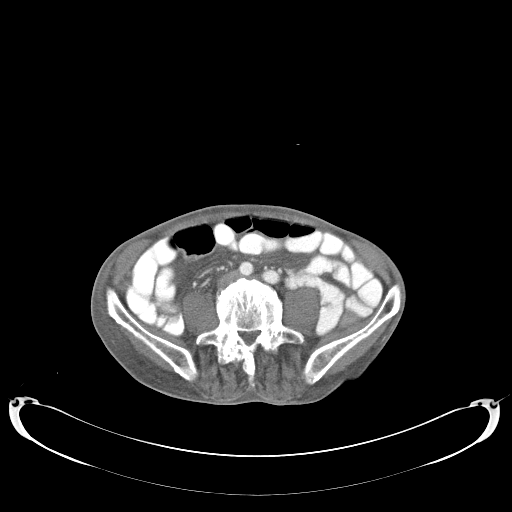
[im 48/124  soft-tissue]
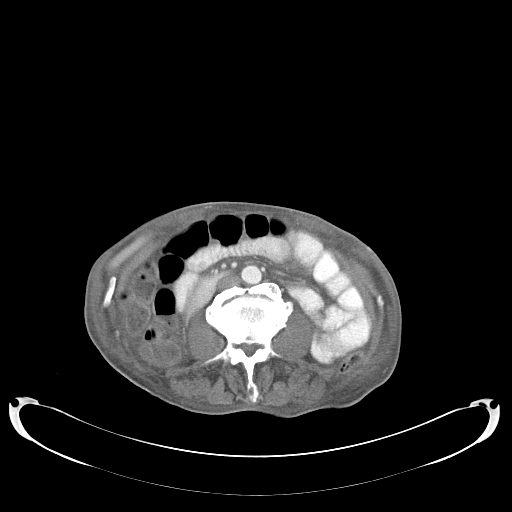
[im 57/124  soft-tissue]
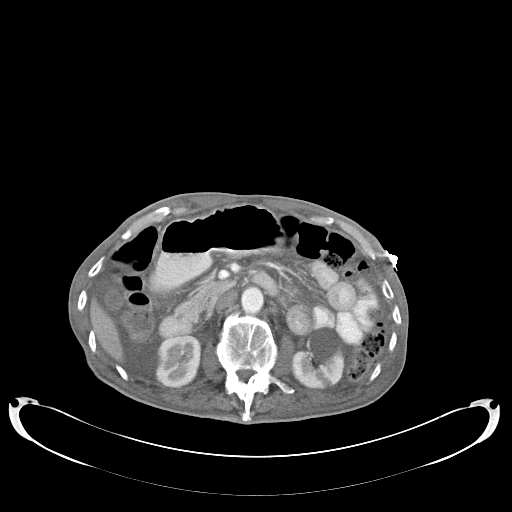
[im 67/124  soft-tissue]
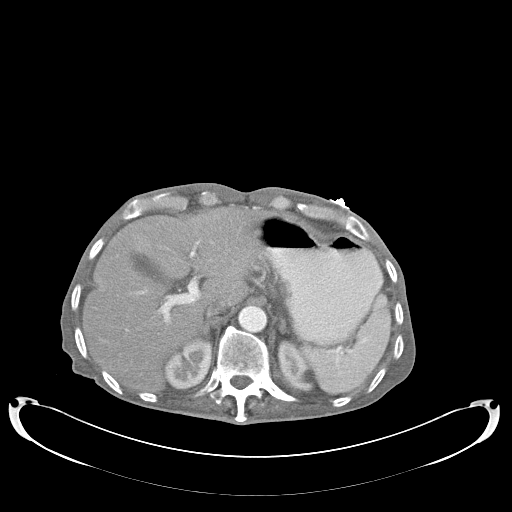
[im 76/124  soft-tissue]
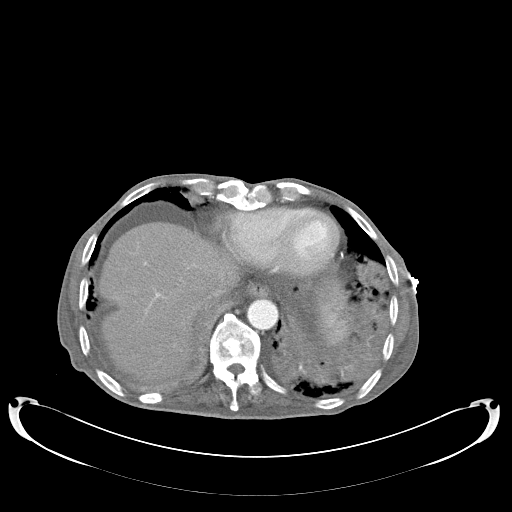
[im 86/124  soft-tissue]
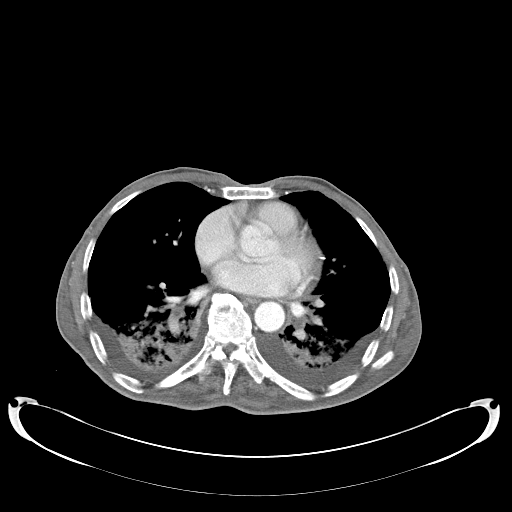
[im 86/124  bone]
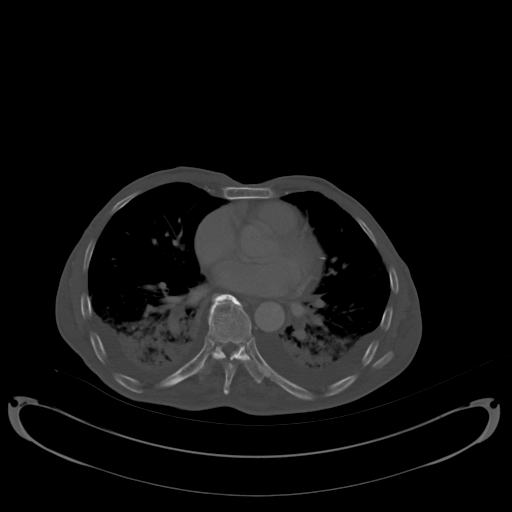
[im 95/124  soft-tissue]
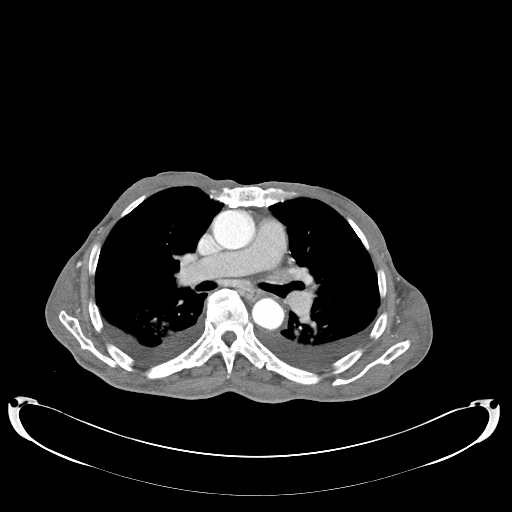
[im 105/124  soft-tissue]
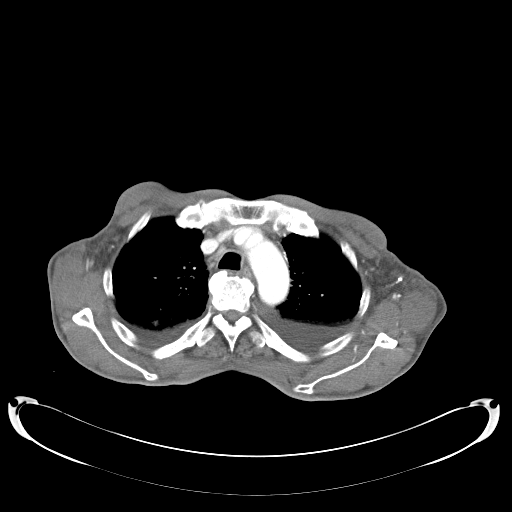
[im 114/124  soft-tissue]
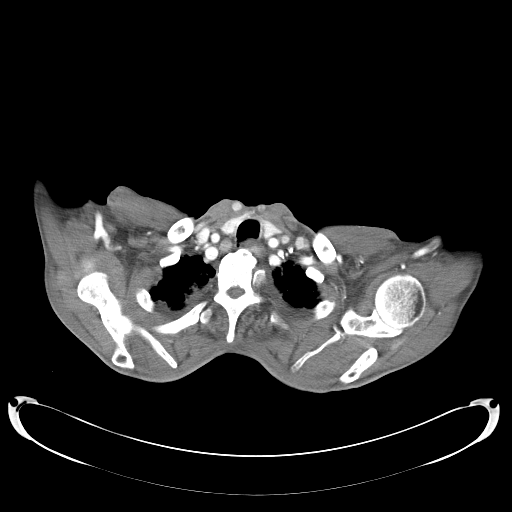

[Series 602: coronal images · coronal · 1.21mm/px · 3 of 125 slices shown]
[im 42/125  soft-tissue]
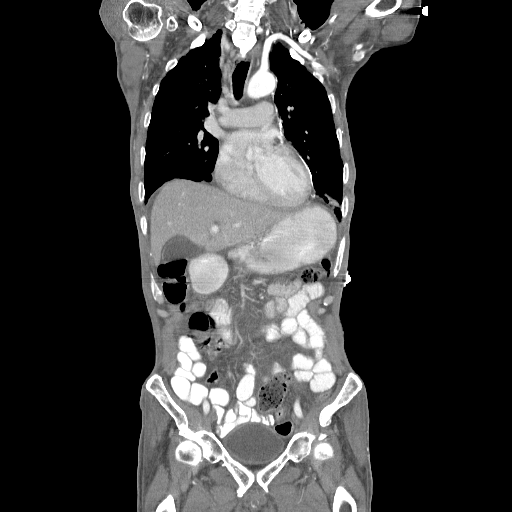
[im 56/125  soft-tissue]
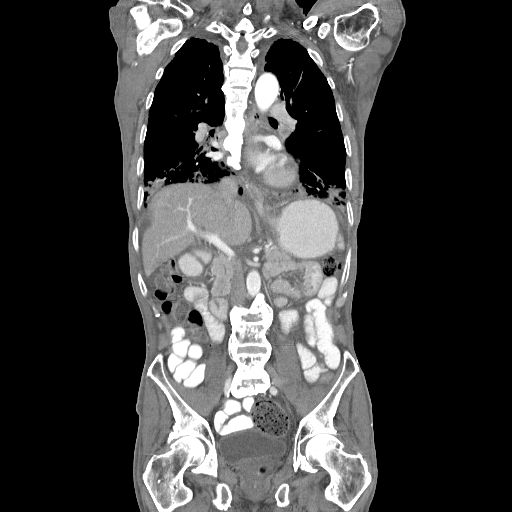
[im 69/125  soft-tissue]
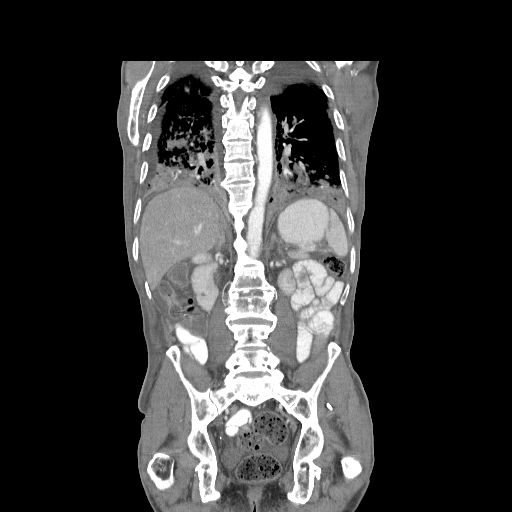

[15 of 46 positions shown; findings below may reference images not displayed]

FINDINGS: CT CHEST

On lung window images, there is extensive opacification throughout
much of the left lower lobes most consistent with severe low lower
lobe pneumonia. Air bronchograms do course through these of
opacities. No definite mass is seen. There are bilateral pleural
effusions present of a moderate degree. Some of the opacity also
appears due to atelectasis. Minimal opacity is noted within the
right upper lobe as well most consistent with multifocal pneumonia
with lesser involvement of the left upper lobe. There is occlusion
of the bronchi to the lower lobes possibly due to inspissated mucus,
with bilateral endobronchial lesions less likely. Followup is
recommended to ensure clearing.

On soft tissue window images, the mid ascending thoracic aorta
measures 34 mm in diameter. The origins of the great vessels appear
patent. The heart is within upper limits of normal. No pericardial
effusion is seen. No mediastinal or hilar adenopathy is seen. The
pulmonary arteries are not well opacified but no central abnormality
is evident. The thoracic vertebrae are normal alignment although
there is diffuse degenerative change present.

CT ABDOMEN AND PELVIS

The liver enhances with no focal abnormality and no ductal
dilatation is seen. No calcified gallstones are seen within the
slightly distended gallbladder. The pancreas is normal in size and
the pancreatic duct is not dilated. The does appear to be some
ascites present. The adrenal glands and spleen are unremarkable. The
stomach is moderately distended with oral contrast with no
abnormality noted. The kidneys enhance and there are bilateral renal
cysts present. On delayed images, the pelvocaliceal systems are
unremarkable and the proximal ureters are normal in caliber. The
abdominal aorta is normal in caliber.

The urinary bladder is moderately urine distended with no
abnormality noted. The prostate is normal in size. There is a
moderate amount of feces throughout the colon. No explanation for
the patient's left lower quadrant pain is seen other than the
presence of a moderate amount of feces throughout the rectosigmoid
colon. No inflammatory process is seen. The lumbar vertebrae are in
normal alignment with diffuse degenerative spurring present. No
compression deformity is seen.
IMPRESSION: 1. Extensive opacification throughout the lungs primarily involving
the lower lobes with air bronchograms most consistent with diffuse
pneumonia with moderate-sized bilateral pleural effusions.
2. Possible inspissated mucus within the bronchi to the lower lobes.
No mediastinal or hilar adenopathy is seen.
3. Small amount of ascites is present within the upper abdomen.
4. No definite explanation for the patient's left lower quadrant
pain is seen other than the presence of a moderate amount of feces
throughout the rectosigmoid colon.
